# Patient Record
Sex: Female | Born: 1960
Health system: Southern US, Community
[De-identification: ages and names within clinical notes are randomized; demographics above are authoritative.]

## PROBLEM LIST (undated history)

## (undated) DIAGNOSIS — F418 Other specified anxiety disorders: Secondary | ICD-10-CM

## (undated) DIAGNOSIS — S92353A Displaced fracture of fifth metatarsal bone, unspecified foot, initial encounter for closed fracture: Secondary | ICD-10-CM

## (undated) DIAGNOSIS — Z860100 Personal history of colon polyps, unspecified: Secondary | ICD-10-CM

## (undated) DIAGNOSIS — E0500 Thyrotoxicosis with diffuse goiter without thyrotoxic crisis or storm: Secondary | ICD-10-CM

## (undated) DIAGNOSIS — M25511 Pain in right shoulder: Secondary | ICD-10-CM

## (undated) DIAGNOSIS — B1910 Unspecified viral hepatitis B without hepatic coma: Secondary | ICD-10-CM

## (undated) DIAGNOSIS — B019 Varicella without complication: Secondary | ICD-10-CM

## (undated) DIAGNOSIS — F429 Obsessive-compulsive disorder, unspecified: Secondary | ICD-10-CM

## (undated) DIAGNOSIS — E0590 Thyrotoxicosis, unspecified without thyrotoxic crisis or storm: Secondary | ICD-10-CM

## (undated) HISTORY — DX: Other specified anxiety disorders: F41.8

## (undated) HISTORY — DX: Pain in right shoulder: M25.511

## (undated) HISTORY — DX: Personal history of colon polyps, unspecified: Z86.0100

## (undated) HISTORY — DX: Thyrotoxicosis with diffuse goiter without thyrotoxic crisis or storm: E05.00

## (undated) HISTORY — DX: Displaced fracture of fifth metatarsal bone, unspecified foot, initial encounter for closed fracture: S92.353A

## (undated) HISTORY — PX: ABDOMINAL HYSTERECTOMY: SHX81

## (undated) HISTORY — PX: WISDOM TOOTH EXTRACTION: SHX21

## (undated) HISTORY — PX: ORBITAL OSTEOTOMY: SHX2114

## (undated) HISTORY — DX: Unspecified viral hepatitis B without hepatic coma: B19.10

## (undated) HISTORY — DX: Varicella without complication: B01.9

## (undated) HISTORY — DX: Thyrotoxicosis, unspecified without thyrotoxic crisis or storm: E05.90

## (undated) HISTORY — DX: Obsessive-compulsive disorder, unspecified: F42.9

## (undated) HISTORY — DX: Personal history of colonic polyps: Z86.010

---
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
## 2003-08-05 DIAGNOSIS — E0500 Thyrotoxicosis with diffuse goiter without thyrotoxic crisis or storm: Secondary | ICD-10-CM

## 2003-08-05 DIAGNOSIS — E0590 Thyrotoxicosis, unspecified without thyrotoxic crisis or storm: Secondary | ICD-10-CM

## 2003-08-05 HISTORY — PX: BLEPHAROPLASTY: SUR158

## 2003-08-05 HISTORY — DX: Thyrotoxicosis with diffuse goiter without thyrotoxic crisis or storm: E05.00

## 2003-08-05 HISTORY — DX: Thyrotoxicosis, unspecified without thyrotoxic crisis or storm: E05.90

## 2009-11-19 ENCOUNTER — Encounter: Admission: RE | Admit: 2009-11-19 | Discharge: 2009-11-19 | Payer: Self-pay | Admitting: Family Medicine

## 2015-08-05 DIAGNOSIS — S92353A Displaced fracture of fifth metatarsal bone, unspecified foot, initial encounter for closed fracture: Secondary | ICD-10-CM

## 2015-08-05 HISTORY — DX: Displaced fracture of fifth metatarsal bone, unspecified foot, initial encounter for closed fracture: S92.353A

## 2015-11-13 DIAGNOSIS — S92351A Displaced fracture of fifth metatarsal bone, right foot, initial encounter for closed fracture: Secondary | ICD-10-CM | POA: Diagnosis not present

## 2015-11-13 DIAGNOSIS — M79671 Pain in right foot: Secondary | ICD-10-CM | POA: Diagnosis not present

## 2015-11-14 DIAGNOSIS — S92354A Nondisplaced fracture of fifth metatarsal bone, right foot, initial encounter for closed fracture: Secondary | ICD-10-CM | POA: Diagnosis not present

## 2015-12-12 DIAGNOSIS — S92354D Nondisplaced fracture of fifth metatarsal bone, right foot, subsequent encounter for fracture with routine healing: Secondary | ICD-10-CM | POA: Diagnosis not present

## 2015-12-24 DIAGNOSIS — F4323 Adjustment disorder with mixed anxiety and depressed mood: Secondary | ICD-10-CM | POA: Diagnosis not present

## 2016-01-02 DIAGNOSIS — F4323 Adjustment disorder with mixed anxiety and depressed mood: Secondary | ICD-10-CM | POA: Diagnosis not present

## 2016-01-08 DIAGNOSIS — F4323 Adjustment disorder with mixed anxiety and depressed mood: Secondary | ICD-10-CM | POA: Diagnosis not present

## 2016-01-09 DIAGNOSIS — S92354D Nondisplaced fracture of fifth metatarsal bone, right foot, subsequent encounter for fracture with routine healing: Secondary | ICD-10-CM | POA: Diagnosis not present

## 2016-01-23 DIAGNOSIS — F4323 Adjustment disorder with mixed anxiety and depressed mood: Secondary | ICD-10-CM | POA: Diagnosis not present

## 2016-01-30 DIAGNOSIS — F4323 Adjustment disorder with mixed anxiety and depressed mood: Secondary | ICD-10-CM | POA: Diagnosis not present

## 2016-02-06 DIAGNOSIS — F4323 Adjustment disorder with mixed anxiety and depressed mood: Secondary | ICD-10-CM | POA: Diagnosis not present

## 2016-02-06 DIAGNOSIS — S92354D Nondisplaced fracture of fifth metatarsal bone, right foot, subsequent encounter for fracture with routine healing: Secondary | ICD-10-CM | POA: Diagnosis not present

## 2016-02-11 DIAGNOSIS — E039 Hypothyroidism, unspecified: Secondary | ICD-10-CM | POA: Diagnosis not present

## 2016-02-11 DIAGNOSIS — F419 Anxiety disorder, unspecified: Secondary | ICD-10-CM | POA: Diagnosis not present

## 2016-02-11 DIAGNOSIS — F339 Major depressive disorder, recurrent, unspecified: Secondary | ICD-10-CM | POA: Diagnosis not present

## 2016-02-12 DIAGNOSIS — F4323 Adjustment disorder with mixed anxiety and depressed mood: Secondary | ICD-10-CM | POA: Diagnosis not present

## 2016-02-20 DIAGNOSIS — F4323 Adjustment disorder with mixed anxiety and depressed mood: Secondary | ICD-10-CM | POA: Diagnosis not present

## 2016-02-26 DIAGNOSIS — S92514A Nondisplaced fracture of proximal phalanx of right lesser toe(s), initial encounter for closed fracture: Secondary | ICD-10-CM | POA: Diagnosis not present

## 2016-02-26 DIAGNOSIS — S9031XA Contusion of right foot, initial encounter: Secondary | ICD-10-CM | POA: Diagnosis not present

## 2016-02-26 DIAGNOSIS — S92524A Nondisplaced fracture of medial phalanx of right lesser toe(s), initial encounter for closed fracture: Secondary | ICD-10-CM | POA: Diagnosis not present

## 2016-02-27 DIAGNOSIS — F4323 Adjustment disorder with mixed anxiety and depressed mood: Secondary | ICD-10-CM | POA: Diagnosis not present

## 2016-03-18 DIAGNOSIS — F4323 Adjustment disorder with mixed anxiety and depressed mood: Secondary | ICD-10-CM | POA: Diagnosis not present

## 2016-04-02 DIAGNOSIS — F4323 Adjustment disorder with mixed anxiety and depressed mood: Secondary | ICD-10-CM | POA: Diagnosis not present

## 2016-08-12 DIAGNOSIS — E039 Hypothyroidism, unspecified: Secondary | ICD-10-CM | POA: Diagnosis not present

## 2016-08-12 DIAGNOSIS — F339 Major depressive disorder, recurrent, unspecified: Secondary | ICD-10-CM | POA: Diagnosis not present

## 2016-08-12 DIAGNOSIS — Z23 Encounter for immunization: Secondary | ICD-10-CM | POA: Diagnosis not present

## 2016-08-12 DIAGNOSIS — F419 Anxiety disorder, unspecified: Secondary | ICD-10-CM | POA: Diagnosis not present

## 2016-09-19 DIAGNOSIS — S82832A Other fracture of upper and lower end of left fibula, initial encounter for closed fracture: Secondary | ICD-10-CM | POA: Diagnosis not present

## 2016-09-19 DIAGNOSIS — M25572 Pain in left ankle and joints of left foot: Secondary | ICD-10-CM | POA: Diagnosis not present

## 2016-10-20 DIAGNOSIS — E039 Hypothyroidism, unspecified: Secondary | ICD-10-CM | POA: Diagnosis not present

## 2016-10-20 DIAGNOSIS — S93402A Sprain of unspecified ligament of left ankle, initial encounter: Secondary | ICD-10-CM | POA: Diagnosis not present

## 2016-10-20 DIAGNOSIS — S82402A Unspecified fracture of shaft of left fibula, initial encounter for closed fracture: Secondary | ICD-10-CM | POA: Diagnosis not present

## 2016-11-11 DIAGNOSIS — S82409A Unspecified fracture of shaft of unspecified fibula, initial encounter for closed fracture: Secondary | ICD-10-CM | POA: Diagnosis not present

## 2016-11-11 DIAGNOSIS — E039 Hypothyroidism, unspecified: Secondary | ICD-10-CM | POA: Diagnosis not present

## 2017-01-21 DIAGNOSIS — M79671 Pain in right foot: Secondary | ICD-10-CM | POA: Diagnosis not present

## 2017-01-21 DIAGNOSIS — L089 Local infection of the skin and subcutaneous tissue, unspecified: Secondary | ICD-10-CM | POA: Diagnosis not present

## 2017-01-21 DIAGNOSIS — E039 Hypothyroidism, unspecified: Secondary | ICD-10-CM | POA: Diagnosis not present

## 2017-01-21 DIAGNOSIS — M25471 Effusion, right ankle: Secondary | ICD-10-CM | POA: Diagnosis not present

## 2017-01-22 DIAGNOSIS — S93491A Sprain of other ligament of right ankle, initial encounter: Secondary | ICD-10-CM | POA: Diagnosis not present

## 2017-02-10 DIAGNOSIS — F419 Anxiety disorder, unspecified: Secondary | ICD-10-CM | POA: Diagnosis not present

## 2017-02-10 DIAGNOSIS — E039 Hypothyroidism, unspecified: Secondary | ICD-10-CM | POA: Diagnosis not present

## 2017-02-10 DIAGNOSIS — F339 Major depressive disorder, recurrent, unspecified: Secondary | ICD-10-CM | POA: Diagnosis not present

## 2017-02-24 DIAGNOSIS — S93491D Sprain of other ligament of right ankle, subsequent encounter: Secondary | ICD-10-CM | POA: Diagnosis not present

## 2017-03-24 DIAGNOSIS — E039 Hypothyroidism, unspecified: Secondary | ICD-10-CM | POA: Diagnosis not present

## 2017-06-29 DIAGNOSIS — E039 Hypothyroidism, unspecified: Secondary | ICD-10-CM | POA: Diagnosis not present

## 2017-07-01 DIAGNOSIS — F419 Anxiety disorder, unspecified: Secondary | ICD-10-CM | POA: Diagnosis not present

## 2017-07-01 DIAGNOSIS — E039 Hypothyroidism, unspecified: Secondary | ICD-10-CM | POA: Diagnosis not present

## 2017-07-01 DIAGNOSIS — F339 Major depressive disorder, recurrent, unspecified: Secondary | ICD-10-CM | POA: Diagnosis not present

## 2017-08-31 DIAGNOSIS — E039 Hypothyroidism, unspecified: Secondary | ICD-10-CM | POA: Diagnosis not present

## 2017-09-23 DIAGNOSIS — F339 Major depressive disorder, recurrent, unspecified: Secondary | ICD-10-CM | POA: Diagnosis not present

## 2017-09-23 DIAGNOSIS — F419 Anxiety disorder, unspecified: Secondary | ICD-10-CM | POA: Diagnosis not present

## 2017-09-25 DIAGNOSIS — F331 Major depressive disorder, recurrent, moderate: Secondary | ICD-10-CM | POA: Diagnosis not present

## 2017-09-30 DIAGNOSIS — F331 Major depressive disorder, recurrent, moderate: Secondary | ICD-10-CM | POA: Diagnosis not present

## 2017-10-16 DIAGNOSIS — F339 Major depressive disorder, recurrent, unspecified: Secondary | ICD-10-CM | POA: Diagnosis not present

## 2017-10-21 DIAGNOSIS — F339 Major depressive disorder, recurrent, unspecified: Secondary | ICD-10-CM | POA: Diagnosis not present

## 2017-10-29 DIAGNOSIS — F339 Major depressive disorder, recurrent, unspecified: Secondary | ICD-10-CM | POA: Diagnosis not present

## 2017-11-04 DIAGNOSIS — F339 Major depressive disorder, recurrent, unspecified: Secondary | ICD-10-CM | POA: Diagnosis not present

## 2017-11-04 DIAGNOSIS — F419 Anxiety disorder, unspecified: Secondary | ICD-10-CM | POA: Diagnosis not present

## 2017-11-04 DIAGNOSIS — M25512 Pain in left shoulder: Secondary | ICD-10-CM | POA: Diagnosis not present

## 2017-11-04 DIAGNOSIS — E039 Hypothyroidism, unspecified: Secondary | ICD-10-CM | POA: Diagnosis not present

## 2017-11-12 DIAGNOSIS — M25511 Pain in right shoulder: Secondary | ICD-10-CM

## 2017-11-12 HISTORY — DX: Pain in right shoulder: M25.511

## 2017-11-19 DIAGNOSIS — M7582 Other shoulder lesions, left shoulder: Secondary | ICD-10-CM | POA: Diagnosis not present

## 2017-12-09 DIAGNOSIS — Z8249 Family history of ischemic heart disease and other diseases of the circulatory system: Secondary | ICD-10-CM | POA: Diagnosis not present

## 2017-12-09 DIAGNOSIS — Z0000 Encounter for general adult medical examination without abnormal findings: Secondary | ICD-10-CM | POA: Diagnosis not present

## 2017-12-09 DIAGNOSIS — Z13220 Encounter for screening for lipoid disorders: Secondary | ICD-10-CM | POA: Diagnosis not present

## 2017-12-09 LAB — BASIC METABOLIC PANEL
BUN: 14 (ref 4–21)
CREATININE: 0.8 (ref 0.5–1.1)
GLUCOSE: 77

## 2017-12-09 LAB — LIPID PANEL
CHOLESTEROL: 174 (ref 0–200)
HDL: 66 (ref 35–70)
LDL Cholesterol: 94
TRIGLYCERIDES: 71 (ref 40–160)

## 2017-12-17 ENCOUNTER — Other Ambulatory Visit: Payer: Self-pay | Admitting: Family Medicine

## 2017-12-17 DIAGNOSIS — Z1231 Encounter for screening mammogram for malignant neoplasm of breast: Secondary | ICD-10-CM

## 2018-01-07 ENCOUNTER — Ambulatory Visit
Admission: RE | Admit: 2018-01-07 | Discharge: 2018-01-07 | Disposition: A | Discharge status: Home / Self Care | Payer: Federal, State, Local not specified - PPO | Source: Ambulatory Visit | Attending: Family Medicine | Admitting: Family Medicine

## 2018-01-07 DIAGNOSIS — Z1231 Encounter for screening mammogram for malignant neoplasm of breast: Secondary | ICD-10-CM | POA: Diagnosis not present

## 2018-01-13 DIAGNOSIS — Z86010 Personal history of colonic polyps: Secondary | ICD-10-CM | POA: Diagnosis not present

## 2018-02-08 DIAGNOSIS — E039 Hypothyroidism, unspecified: Secondary | ICD-10-CM | POA: Diagnosis not present

## 2018-03-08 DIAGNOSIS — D126 Benign neoplasm of colon, unspecified: Secondary | ICD-10-CM | POA: Diagnosis not present

## 2018-03-08 DIAGNOSIS — Z86010 Personal history of colonic polyps: Secondary | ICD-10-CM | POA: Diagnosis not present

## 2018-03-08 LAB — HM COLONOSCOPY

## 2018-03-10 DIAGNOSIS — D126 Benign neoplasm of colon, unspecified: Secondary | ICD-10-CM | POA: Diagnosis not present

## 2018-03-16 DIAGNOSIS — F411 Generalized anxiety disorder: Secondary | ICD-10-CM | POA: Diagnosis not present

## 2018-03-30 DIAGNOSIS — F411 Generalized anxiety disorder: Secondary | ICD-10-CM | POA: Diagnosis not present

## 2018-04-07 DIAGNOSIS — F411 Generalized anxiety disorder: Secondary | ICD-10-CM | POA: Diagnosis not present

## 2018-04-22 DIAGNOSIS — F332 Major depressive disorder, recurrent severe without psychotic features: Secondary | ICD-10-CM | POA: Diagnosis not present

## 2018-04-22 DIAGNOSIS — F411 Generalized anxiety disorder: Secondary | ICD-10-CM | POA: Diagnosis not present

## 2018-05-13 DIAGNOSIS — F411 Generalized anxiety disorder: Secondary | ICD-10-CM | POA: Diagnosis not present

## 2018-05-18 DIAGNOSIS — E039 Hypothyroidism, unspecified: Secondary | ICD-10-CM | POA: Diagnosis not present

## 2018-05-18 LAB — TSH: TSH: 11.6 — AB (ref 0.41–5.90)

## 2018-05-27 DIAGNOSIS — F411 Generalized anxiety disorder: Secondary | ICD-10-CM | POA: Diagnosis not present

## 2018-05-27 DIAGNOSIS — F332 Major depressive disorder, recurrent severe without psychotic features: Secondary | ICD-10-CM | POA: Diagnosis not present

## 2018-07-13 DIAGNOSIS — F418 Other specified anxiety disorders: Secondary | ICD-10-CM | POA: Diagnosis not present

## 2018-07-13 DIAGNOSIS — E0500 Thyrotoxicosis with diffuse goiter without thyrotoxic crisis or storm: Secondary | ICD-10-CM | POA: Diagnosis not present

## 2018-07-13 LAB — TSH: TSH: 1.97 (ref <=5.90)

## 2018-09-07 DIAGNOSIS — F418 Other specified anxiety disorders: Secondary | ICD-10-CM | POA: Diagnosis not present

## 2018-09-07 DIAGNOSIS — E0500 Thyrotoxicosis with diffuse goiter without thyrotoxic crisis or storm: Secondary | ICD-10-CM | POA: Diagnosis not present

## 2018-09-13 DIAGNOSIS — F332 Major depressive disorder, recurrent severe without psychotic features: Secondary | ICD-10-CM | POA: Diagnosis not present

## 2018-09-13 DIAGNOSIS — F411 Generalized anxiety disorder: Secondary | ICD-10-CM | POA: Diagnosis not present

## 2018-09-20 DIAGNOSIS — F332 Major depressive disorder, recurrent severe without psychotic features: Secondary | ICD-10-CM | POA: Diagnosis not present

## 2018-09-20 DIAGNOSIS — F411 Generalized anxiety disorder: Secondary | ICD-10-CM | POA: Diagnosis not present

## 2018-09-21 ENCOUNTER — Encounter (HOSPITAL_COMMUNITY): Payer: Self-pay | Admitting: Psychiatry

## 2018-09-21 ENCOUNTER — Encounter (HOSPITAL_COMMUNITY): Payer: Self-pay

## 2018-09-21 ENCOUNTER — Ambulatory Visit (HOSPITAL_COMMUNITY): Payer: Self-pay | Admitting: Psychiatry

## 2018-09-21 ENCOUNTER — Other Ambulatory Visit (HOSPITAL_COMMUNITY): Payer: Federal, State, Local not specified - PPO | Attending: Psychiatry | Admitting: Psychiatry

## 2018-09-21 DIAGNOSIS — F1021 Alcohol dependence, in remission: Secondary | ICD-10-CM | POA: Diagnosis not present

## 2018-09-21 DIAGNOSIS — R45851 Suicidal ideations: Secondary | ICD-10-CM | POA: Insufficient documentation

## 2018-09-21 DIAGNOSIS — Z87891 Personal history of nicotine dependence: Secondary | ICD-10-CM | POA: Insufficient documentation

## 2018-09-21 DIAGNOSIS — F4310 Post-traumatic stress disorder, unspecified: Secondary | ICD-10-CM | POA: Diagnosis not present

## 2018-09-21 DIAGNOSIS — Z79899 Other long term (current) drug therapy: Secondary | ICD-10-CM | POA: Diagnosis not present

## 2018-09-21 DIAGNOSIS — F330 Major depressive disorder, recurrent, mild: Secondary | ICD-10-CM | POA: Insufficient documentation

## 2018-09-21 DIAGNOSIS — F632 Kleptomania: Secondary | ICD-10-CM | POA: Insufficient documentation

## 2018-09-21 DIAGNOSIS — Z79890 Hormone replacement therapy (postmenopausal): Secondary | ICD-10-CM | POA: Insufficient documentation

## 2018-09-21 DIAGNOSIS — F332 Major depressive disorder, recurrent severe without psychotic features: Secondary | ICD-10-CM | POA: Diagnosis not present

## 2018-09-21 DIAGNOSIS — F411 Generalized anxiety disorder: Secondary | ICD-10-CM | POA: Insufficient documentation

## 2018-09-21 DIAGNOSIS — F331 Major depressive disorder, recurrent, moderate: Secondary | ICD-10-CM

## 2018-09-21 NOTE — Progress Notes (Signed)
    Daily Group Progress Note  Program: IOP  Group Time: 9am-12pm  Participation Level: Active  Behavioral Response: Appropriate, Sharing and Motivated  Type of Therapy:  Group Therapy; psycho-educational group, process group  Summary of Progress:  The purpose of this group is to utilize CBT and DBT skills in a group setting to increase use of healthy coping skills and decrease frequency and intensity of active mental health symptoms  9am-11am Clinician checked in with group members, assessing for SI/HI/psychosis and problematic symptoms. Clinician presented the topic of 'Letting Go of Emotional Suffering: Mindfulness of Your Current Emotion.' Clinician presented the DBT skill Ride the Wave related to distress tolerance. Clinician and group members discussed identifying and accepting feelings while limiting judgment and self criticism related to automatic thoughts and feelings. Clinician validated client feelings and utilized active listening skills including summarizing and reflective statements.Clinician provided clients with Sensory Coping Skills. Group members practiced in session and discussed modifications which can be used at work. Clinician provided options for focused breathing skills and practiced in session box breathing and other breathing exercises including utilizing breathing skills while walking to help distract from ruminating thoughts and feeling of anxiety in the body.  11am-12pm Process group co-facilitated by Chaplin with a focus on losses and how losses are grieved. Client participated in check in and grief/loss discussion, sharing struggle with non-resolved relationship with father who passed away. Client noted that due to this strained relationship, she raised her children making sure she showed love an affection daily. During group client completed assessment and met with psychiatrist. See notes for additional details.    Olegario Messier, LCSW
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
## 2018-09-22 ENCOUNTER — Other Ambulatory Visit (HOSPITAL_COMMUNITY): Payer: Federal, State, Local not specified - PPO | Admitting: Licensed Clinical Social Worker

## 2018-09-22 DIAGNOSIS — F411 Generalized anxiety disorder: Secondary | ICD-10-CM

## 2018-09-22 DIAGNOSIS — Z87891 Personal history of nicotine dependence: Secondary | ICD-10-CM | POA: Diagnosis not present

## 2018-09-22 DIAGNOSIS — F4310 Post-traumatic stress disorder, unspecified: Secondary | ICD-10-CM | POA: Diagnosis not present

## 2018-09-22 DIAGNOSIS — Z79890 Hormone replacement therapy (postmenopausal): Secondary | ICD-10-CM | POA: Diagnosis not present

## 2018-09-22 DIAGNOSIS — Z79899 Other long term (current) drug therapy: Secondary | ICD-10-CM | POA: Diagnosis not present

## 2018-09-22 DIAGNOSIS — F331 Major depressive disorder, recurrent, moderate: Secondary | ICD-10-CM

## 2018-09-22 DIAGNOSIS — F632 Kleptomania: Secondary | ICD-10-CM | POA: Diagnosis not present

## 2018-09-22 DIAGNOSIS — R45851 Suicidal ideations: Secondary | ICD-10-CM | POA: Diagnosis not present

## 2018-09-22 DIAGNOSIS — F1021 Alcohol dependence, in remission: Secondary | ICD-10-CM | POA: Diagnosis not present

## 2018-09-22 DIAGNOSIS — F332 Major depressive disorder, recurrent severe without psychotic features: Secondary | ICD-10-CM | POA: Diagnosis not present

## 2018-09-23 ENCOUNTER — Other Ambulatory Visit (HOSPITAL_COMMUNITY): Payer: Federal, State, Local not specified - PPO | Admitting: Licensed Clinical Social Worker

## 2018-09-23 DIAGNOSIS — F331 Major depressive disorder, recurrent, moderate: Secondary | ICD-10-CM

## 2018-09-23 DIAGNOSIS — F332 Major depressive disorder, recurrent severe without psychotic features: Secondary | ICD-10-CM | POA: Diagnosis not present

## 2018-09-23 DIAGNOSIS — R45851 Suicidal ideations: Secondary | ICD-10-CM | POA: Diagnosis not present

## 2018-09-23 DIAGNOSIS — Z79899 Other long term (current) drug therapy: Secondary | ICD-10-CM | POA: Diagnosis not present

## 2018-09-23 DIAGNOSIS — F632 Kleptomania: Secondary | ICD-10-CM | POA: Diagnosis not present

## 2018-09-23 DIAGNOSIS — F4310 Post-traumatic stress disorder, unspecified: Secondary | ICD-10-CM | POA: Diagnosis not present

## 2018-09-23 DIAGNOSIS — Z87891 Personal history of nicotine dependence: Secondary | ICD-10-CM | POA: Diagnosis not present

## 2018-09-23 DIAGNOSIS — Z79890 Hormone replacement therapy (postmenopausal): Secondary | ICD-10-CM | POA: Diagnosis not present

## 2018-09-23 DIAGNOSIS — F411 Generalized anxiety disorder: Secondary | ICD-10-CM | POA: Diagnosis not present

## 2018-09-23 DIAGNOSIS — F1021 Alcohol dependence, in remission: Secondary | ICD-10-CM | POA: Diagnosis not present

## 2018-09-24 ENCOUNTER — Telehealth (HOSPITAL_COMMUNITY): Payer: Self-pay | Admitting: Psychiatry

## 2018-09-24 ENCOUNTER — Other Ambulatory Visit (HOSPITAL_COMMUNITY): Payer: Federal, State, Local not specified - PPO | Admitting: Psychiatry

## 2018-09-27 ENCOUNTER — Telehealth (HOSPITAL_COMMUNITY): Payer: Self-pay | Admitting: Psychiatry

## 2018-09-27 ENCOUNTER — Other Ambulatory Visit (HOSPITAL_COMMUNITY): Payer: Federal, State, Local not specified - PPO | Admitting: Psychiatry

## 2018-09-27 NOTE — Progress Notes (Signed)
    Daily Group Progress Note  Program: IOP  Group Time: 9am-12pm  Participation Level: Active  Behavioral Response: Appropriate  Type of Therapy:  Group Therapy; process group, psycho-educational group  Summary of Progress:  Clinician and group members reviewed options to improve communication skills, including using I-statements, verbalizing thoughts, feelings and needs to others. Clinician and group members discussed having a goal of interactions prior to having conversations. Clinician presented Interpersonal Effectiveness skills DEARMAN, GIVE, and FAST. Clinician and group members processed how healthy or unhealthy communication has effected their relationships with others and self. Clinician facilitated discussion between group members related to maintaining hopefulness to address symptoms. Clients discussed difficulty with how mental health is viewed and addressed compared to physical illness. Clinician and group members discussed how to use assertive communication to get needs met when others do note see situations the same as client.  11am-12pm Yoga as mindfulness exercise facilitated by co-facilitator. Client participated in all group discussions and activities.  Olegario Messier, LCSW

## 2018-09-27 NOTE — Telephone Encounter (Signed)
D:  Pt had informed writer last week that she had an upcoming appt scheduled for today.  A:  Inform treatment team.

## 2018-09-27 NOTE — Progress Notes (Signed)
    Daily Group Progress Note  Program: IOP  Group Time: 9am-12pm  Participation Level: Active  Behavioral Response: Appropriate  Type of Therapy:  Group Therapy; process group, psycho-educational group  Summary of Progress:  The purpose of this group is to utilize CBT and DBT skills in a group setting to increase use of healthy coping skills and decrease frequency and intensity of active mental health symptoms.  9am-11am Clinician checked in with group members, assessing for SI/HI/psychosis and overall level of functioning. Clinician utilized 'Loaded Question' icebreaker to discuss self-sabotaging behaviors. Clinician presented the topic of Communication Roadblocks. Clinician and group members processed several examples including assumptions, poor listening, overreacting, and not being clear. Clinician and group members processed examples in daily living where these have been a problem, especially when creating boundaries. Clinician and group members discussed options to improve communication skills, including using I-statements, verbalizing thoughts, feelings and needs to others. Clinician and group members role played scenarios in group to gain mastery over roadblocks.  11am-12pm Psycho-educational group co-facilitated by Wellness staff with a focus on improving stress management by addressing physical health. Group members set goals in areas of nutrition, sleep, and exercise. Client participated in all group discussion and activities. Client shows progress toward goals as evidence by identifying wellness goals and how they could benefit her daily living.  Harlon Ditty, LCSW

## 2018-09-28 ENCOUNTER — Other Ambulatory Visit (HOSPITAL_COMMUNITY): Payer: Federal, State, Local not specified - PPO | Admitting: Licensed Clinical Social Worker

## 2018-09-28 DIAGNOSIS — F1021 Alcohol dependence, in remission: Secondary | ICD-10-CM | POA: Diagnosis not present

## 2018-09-28 DIAGNOSIS — Z79899 Other long term (current) drug therapy: Secondary | ICD-10-CM | POA: Diagnosis not present

## 2018-09-28 DIAGNOSIS — F411 Generalized anxiety disorder: Secondary | ICD-10-CM | POA: Diagnosis not present

## 2018-09-28 DIAGNOSIS — Z87891 Personal history of nicotine dependence: Secondary | ICD-10-CM | POA: Diagnosis not present

## 2018-09-28 DIAGNOSIS — F632 Kleptomania: Secondary | ICD-10-CM | POA: Diagnosis not present

## 2018-09-28 DIAGNOSIS — F4310 Post-traumatic stress disorder, unspecified: Secondary | ICD-10-CM | POA: Diagnosis not present

## 2018-09-28 DIAGNOSIS — Z79890 Hormone replacement therapy (postmenopausal): Secondary | ICD-10-CM | POA: Diagnosis not present

## 2018-09-28 DIAGNOSIS — R45851 Suicidal ideations: Secondary | ICD-10-CM | POA: Diagnosis not present

## 2018-09-28 DIAGNOSIS — F332 Major depressive disorder, recurrent severe without psychotic features: Secondary | ICD-10-CM | POA: Diagnosis not present

## 2018-09-29 ENCOUNTER — Other Ambulatory Visit (HOSPITAL_COMMUNITY): Payer: Federal, State, Local not specified - PPO

## 2018-09-30 ENCOUNTER — Other Ambulatory Visit (HOSPITAL_COMMUNITY): Payer: Federal, State, Local not specified - PPO | Admitting: Licensed Clinical Social Worker

## 2018-09-30 DIAGNOSIS — F332 Major depressive disorder, recurrent severe without psychotic features: Secondary | ICD-10-CM | POA: Diagnosis not present

## 2018-09-30 DIAGNOSIS — Z87891 Personal history of nicotine dependence: Secondary | ICD-10-CM | POA: Diagnosis not present

## 2018-09-30 DIAGNOSIS — F632 Kleptomania: Secondary | ICD-10-CM | POA: Diagnosis not present

## 2018-09-30 DIAGNOSIS — F411 Generalized anxiety disorder: Secondary | ICD-10-CM | POA: Diagnosis not present

## 2018-09-30 DIAGNOSIS — R45851 Suicidal ideations: Secondary | ICD-10-CM | POA: Diagnosis not present

## 2018-09-30 DIAGNOSIS — F331 Major depressive disorder, recurrent, moderate: Secondary | ICD-10-CM

## 2018-09-30 DIAGNOSIS — F1021 Alcohol dependence, in remission: Secondary | ICD-10-CM | POA: Diagnosis not present

## 2018-09-30 DIAGNOSIS — F4310 Post-traumatic stress disorder, unspecified: Secondary | ICD-10-CM | POA: Diagnosis not present

## 2018-09-30 DIAGNOSIS — Z79899 Other long term (current) drug therapy: Secondary | ICD-10-CM | POA: Diagnosis not present

## 2018-09-30 DIAGNOSIS — Z79890 Hormone replacement therapy (postmenopausal): Secondary | ICD-10-CM | POA: Diagnosis not present

## 2018-10-01 ENCOUNTER — Encounter (HOSPITAL_COMMUNITY): Payer: Self-pay | Admitting: Family

## 2018-10-01 ENCOUNTER — Other Ambulatory Visit (HOSPITAL_COMMUNITY): Payer: Federal, State, Local not specified - PPO | Admitting: Licensed Clinical Social Worker

## 2018-10-01 DIAGNOSIS — F4310 Post-traumatic stress disorder, unspecified: Secondary | ICD-10-CM | POA: Diagnosis not present

## 2018-10-01 DIAGNOSIS — F411 Generalized anxiety disorder: Secondary | ICD-10-CM | POA: Diagnosis not present

## 2018-10-01 DIAGNOSIS — Z79899 Other long term (current) drug therapy: Secondary | ICD-10-CM | POA: Diagnosis not present

## 2018-10-01 DIAGNOSIS — Z87891 Personal history of nicotine dependence: Secondary | ICD-10-CM | POA: Diagnosis not present

## 2018-10-01 DIAGNOSIS — F1021 Alcohol dependence, in remission: Secondary | ICD-10-CM | POA: Diagnosis not present

## 2018-10-01 DIAGNOSIS — Z79890 Hormone replacement therapy (postmenopausal): Secondary | ICD-10-CM | POA: Diagnosis not present

## 2018-10-01 DIAGNOSIS — R45851 Suicidal ideations: Secondary | ICD-10-CM | POA: Diagnosis not present

## 2018-10-01 DIAGNOSIS — F331 Major depressive disorder, recurrent, moderate: Secondary | ICD-10-CM

## 2018-10-01 DIAGNOSIS — F332 Major depressive disorder, recurrent severe without psychotic features: Secondary | ICD-10-CM | POA: Diagnosis not present

## 2018-10-01 DIAGNOSIS — F632 Kleptomania: Secondary | ICD-10-CM | POA: Diagnosis not present

## 2018-10-01 NOTE — Progress Notes (Signed)
  [facility] Intensive Outpatient Program Discharge Summary  Katie Rojas [id number]  Admission date: 09/21/2018 Discharge date: 10/01/2018  Reason for admission: Worsen Anxiety and Depression  Per admission assessment  Note: Katie Rojas 58 yo married female with long hx of anxiety predominantly genaralized type now (in the past also panic attacks) and recurrent depression which has worsened over past few months. She admits to having  Increased frequency of suicidal thoughst w/o plan. She has one past suicidal attempt and one inpatient psychiatric admission for severe anxiety attacks. Currently she denies having intense panic attacks but continues to struggle with general worrying. Patient is at this time followed by her PCP. Klonopin, which she had been on for close to 30 years, has recently been decreased from 1 mg bid to 1 mg in am and 0.5 mg at HS. Zoloft dose was increased at the same time to 150 mg. She is also on 300 mg of XR Wellbutrin.  In the past [name] also tried paroxetine and escitalopram. Katie Rojas admits to episodes of compulsive shoplifting with tension, excitement befire it happens and dysphoria, regret afterwards. She has been charged with shoplifting tyhree times in the past.   Family of Origin Issues: Reports her husband has been supportive since she has started attending Intensive Outpatient Program. (IOP) States she recently stopped drinking about 1 month ago. "before things got out of hand." Denied that she has substance abuse issues.   Progress in Program Toward Treatment Goals: Per treatment team Katie Rojas attended and participated with daily group sessions. Reports she is working on having a "positive outlook on life." Currently denying suicidal or homicidal ideations.  Progress (rationale): Katie Rojas to follow-up with therapist Denease Keenen and   Dr. Hinton Dyer on 10/19/2018  Take all medications as prescribed. Keep all follow-up appointments as scheduled.  Do not  consume alcohol or use illegal drugs while on prescription medications. Report any adverse effects from your medications to your primary care provider promptly.  In the event of recurrent symptoms or worsening symptoms, call 911, a crisis hotline, or go to the nearest emergency department for evaluation.    Katie Rack, NP 10/01/2018

## 2018-10-01 NOTE — Progress Notes (Signed)
    Daily Group Progress Note  Program: IOP  Group Time: 9am-12pm  Participation Level: Active  Behavioral Response: Appropriate  Type of Therapy:  Group Therapy; psycho-educational group, process group  Summary of Progress:  The purpose of the group is to utilize CBT and DBT skills in a face to face group setting to increase use of healthy coping skills and decrease frequency or intensity of active mental health symptoms.  9am-10:30am Clinician checked in with group members, assessing for SI/HI/psychosis and overall level of functioning. Clinician and group members completed 'Loaded Questions' icebreaker and clinician praised clients use of vulnerability in group setting. Clinician presented Self-Care Wheel and processed with clients the importance of a Balanced Life. Clinician and group members discussed ways to incorporate self care into routine and the benefits and barriers. Clinician and group members reviewed physical, psychological, emotional, spiritual, personal, and professional assessments and discussed plan to implement at least one healthy change in the next week. Clinician and group members created crisis plans, including identifying triggers/stressors and related feelings, early warning signs, coping skills and support systems, helpful and not helpful behaviors for support system to be aware of, and contact information for crisis situations.  10:30am-12pm Clinician and group members reviewed Coping Skills Worksheet for additional information to add to crisis plan. Clinician and group members discussed types of skills, examples, and pros and cons of each style of skill. Clinician and group also reviewed A-Z coping worksheet. Clinician and group members participated in [place] game, practicing managing mild anxiety in the moment, and discussing questions related to improving self esteem. Clinician inquired about self care activity planned for the weekend. Client participated in all group  activities and discussions. Client notes in particular on the wheel of self care, she did the opposite of each recommendation for the professional section. Client show progress toward goals as evidence by engaging in group discussions, identifying skills previously helpful and re-implimenting, such as journaling, and was able to participate in anxiety provoking activity in group despite reported anxiety.  Harlon Ditty, LCSW

## 2018-10-01 NOTE — Patient Instructions (Signed)
D:  Patient will successfully complete MH-IOP on 10-04-18.  A:  Discharge on 10-04-18.  Follow up with Dr. Hinton Dyer on 10-19-18 @ 9 a.m and patient will reschedule with therapist Skoglund Payne, [facility]).  Encouraged support groups.  R:  Patient receptive.

## 2018-10-04 ENCOUNTER — Other Ambulatory Visit (HOSPITAL_COMMUNITY): Payer: Federal, State, Local not specified - PPO | Attending: Psychiatry | Admitting: Licensed Clinical Social Worker

## 2018-10-04 DIAGNOSIS — F419 Anxiety disorder, unspecified: Secondary | ICD-10-CM | POA: Diagnosis not present

## 2018-10-04 DIAGNOSIS — Z818 Family history of other mental and behavioral disorders: Secondary | ICD-10-CM | POA: Diagnosis not present

## 2018-10-04 DIAGNOSIS — Z62810 Personal history of physical and sexual abuse in childhood: Secondary | ICD-10-CM | POA: Diagnosis not present

## 2018-10-04 DIAGNOSIS — F332 Major depressive disorder, recurrent severe without psychotic features: Secondary | ICD-10-CM | POA: Diagnosis not present

## 2018-10-04 DIAGNOSIS — F329 Major depressive disorder, single episode, unspecified: Secondary | ICD-10-CM | POA: Insufficient documentation

## 2018-10-04 DIAGNOSIS — F331 Major depressive disorder, recurrent, moderate: Secondary | ICD-10-CM

## 2018-10-04 NOTE — Progress Notes (Signed)
    Daily Group Progress Note  Program: IOP  Group Time: 9am-12pm  Participation Level: Active  Behavioral Response: Appropriate  Type of Therapy:  Group Therapy; process group, psycho-educational group  Summary of Progress:  The purpose of this group is to utilize CBT and DBT skills in a group setting to increase use of healthy coping skills and decrease frequency and intensity of active mental health symptoms.  9am-10:30am Clinician checked in with group members, assessing for SI/HI/psychosis and overall level of functioning. Clinician inquired about completed self care activities for members previously in attendance. Clinician and group members processed 'loaded questions' icebreaker, focusing on 'lying to save face.' Clinician presented the topic of mindfulness and group members discussed barriers to utilizing mindfulness as well as potential benefits. Clinician facilitated pebble meditation in session to practice mindfulness skill.  10:30am-12pm: Clinician presented mindfulness information on WHAT and HOW skills. Clinician and group members created worry dolls, focusing on using distraction skills for distress tolerance. Clinician provided clients with TIPP skill as a response to intense emotions. Clinician checked out inquiring about a self care activity for the day. Client engaged in discussion and activities. Client noted that crafting and repetitive activities she finds helpful for a distraction in managing symptoms.  Harlon Ditty, LCSW
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
## 2018-10-05 ENCOUNTER — Other Ambulatory Visit (HOSPITAL_COMMUNITY): Payer: Federal, State, Local not specified - PPO

## 2018-10-05 NOTE — Progress Notes (Signed)
    Daily Group Progress Note  Program: IOP  Group Time: 9am-12pm  Participation Level: Active  Behavioral Response: Appropriate  Type of Therapy:  Group Therapy; psycho-educational group, process group  Summary of Progress:  The purpose of this engagement is to utilize CBT and DBT skills in a group setting to increase use of healthy coping skills and decrease frequency and intensity of active mental helath symtposm.  9am-11am Clinician checked in with group members, assessing for SI/HI/psychosis and overall level of functioning. Clinician inquired about recent stressors and skills attempted. Clinician presented the topic of Setting Boundaries for healthy relationships. Clinician facilitated discussion between group members on what healthy boundaries look like with different people. Clinician provided clients with worksheet with signs of healthy and unhealthy boundaries. Clinician and group members discuss how poor boundaries are created and maintained.  11am-12pm Process group facilitated by [place] with a focus on grief and loss. Client engaged in all discussion and activities. Client notes her daughters set boundaries with her which she later identified as needed and effective.  Harlon Ditty, LCSW

## 2018-10-06 ENCOUNTER — Other Ambulatory Visit (HOSPITAL_COMMUNITY): Payer: Federal, State, Local not specified - PPO

## 2018-10-07 ENCOUNTER — Other Ambulatory Visit (HOSPITAL_COMMUNITY): Payer: Federal, State, Local not specified - PPO

## 2018-10-08 ENCOUNTER — Other Ambulatory Visit (HOSPITAL_COMMUNITY): Payer: Federal, State, Local not specified - PPO

## 2018-10-11 ENCOUNTER — Other Ambulatory Visit (HOSPITAL_COMMUNITY): Payer: Federal, State, Local not specified - PPO

## 2018-10-11 NOTE — Progress Notes (Signed)
    Daily Group Progress Note  Program: IOP  Group Time: 9am-12pm  Participation Level: Active  Behavioral Response: Appropriate  Type of Therapy:  Group Therapy; process group, psycho-educational group  Summary of Progress:  The purpose of this group is to utilize CBT and DBT Skills in a group setting to increase the use of healthy coping skills and decrease the frequency and intensity of mental health symptoms.  9am-10:30am  Clinician checked in with group members assessing for SI/HI/Psychosis and any recent changes in their lives as well as use of adaptive coping skills. Clients utilized loaded questions to process pressing concerns including the importance of love and coping with discontent.  10:30am-12pm  Clinician presented 'Brain Dump' activity and encouraged clients to write out stream of consciousness activities to document and analyze their thoughts in the moment. Clinician facilitated discussion on stressors clients had control of in their lives and combatted negative thinking via Socratic questioning. Clinician facilitated discussion over identifying which aspects of a situation clients did or did not have control over, and responses to both situations. Clinician provided clients with a list of Distress Tolerance skills and encouraged clients to consider which they could realistically integrate into their lives. Client was insightful regarding her areas of concern. She described her stressors as well as her hopes moving forward. Client was able to successfully dispute negative thoughts. Expressed concerns about her future prognosis and was encouraged by clinician to consider the skills she has learned and how they can be referred to and implemented during difficult times. Client expressed her attitude toward her childhood trauma and was assisted by the clinician in reframing and evaluating her past trauma's impact in her daily life.    Harlon Ditty, LCSW

## 2018-10-12 ENCOUNTER — Other Ambulatory Visit (HOSPITAL_COMMUNITY): Payer: Federal, State, Local not specified - PPO

## 2018-10-13 ENCOUNTER — Other Ambulatory Visit (HOSPITAL_COMMUNITY): Payer: Federal, State, Local not specified - PPO

## 2018-10-14 ENCOUNTER — Other Ambulatory Visit (HOSPITAL_COMMUNITY): Payer: Federal, State, Local not specified - PPO

## 2018-10-15 ENCOUNTER — Other Ambulatory Visit (HOSPITAL_COMMUNITY): Payer: Federal, State, Local not specified - PPO

## 2018-10-18 ENCOUNTER — Other Ambulatory Visit (HOSPITAL_COMMUNITY): Payer: Federal, State, Local not specified - PPO

## 2018-10-19 ENCOUNTER — Other Ambulatory Visit (HOSPITAL_COMMUNITY): Payer: Self-pay

## 2018-10-19 ENCOUNTER — Encounter (HOSPITAL_COMMUNITY): Payer: Self-pay | Admitting: Psychiatry

## 2018-10-19 ENCOUNTER — Other Ambulatory Visit: Payer: Self-pay

## 2018-10-19 ENCOUNTER — Other Ambulatory Visit (HOSPITAL_COMMUNITY): Payer: Federal, State, Local not specified - PPO

## 2018-10-19 ENCOUNTER — Ambulatory Visit (INDEPENDENT_AMBULATORY_CARE_PROVIDER_SITE_OTHER): Payer: Federal, State, Local not specified - PPO | Admitting: Psychiatry

## 2018-10-19 VITALS — BP 108/71 | HR 80 | Temp 98.5°F | Ht 65.0 in | Wt 140.0 lb

## 2018-10-19 DIAGNOSIS — F1010 Alcohol abuse, uncomplicated: Secondary | ICD-10-CM | POA: Insufficient documentation

## 2018-10-19 DIAGNOSIS — F632 Kleptomania: Secondary | ICD-10-CM | POA: Diagnosis not present

## 2018-10-19 DIAGNOSIS — F331 Major depressive disorder, recurrent, moderate: Secondary | ICD-10-CM | POA: Diagnosis not present

## 2018-10-19 DIAGNOSIS — F1021 Alcohol dependence, in remission: Secondary | ICD-10-CM

## 2018-10-19 DIAGNOSIS — F411 Generalized anxiety disorder: Secondary | ICD-10-CM | POA: Diagnosis not present

## 2018-10-19 MED ORDER — CLONAZEPAM 1 MG PO TABS
1.0000 mg | ORAL_TABLET | Freq: Two times a day (BID) | ORAL | 2 refills | Status: DC
Start: 2018-10-19 — End: 2018-11-08

## 2018-10-19 MED ORDER — SERTRALINE HCL 100 MG PO TABS: 200.0000 mg | ORAL_TABLET | Freq: Every day | ORAL | 0 refills | Status: DC

## 2018-10-19 MED ORDER — BUPROPION HCL ER (XL) 300 MG PO TB24: 300.0000 mg | ORAL_TABLET | Freq: Every day | ORAL | 0 refills | Status: DC

## 2018-10-19 MED ORDER — SERTRALINE HCL 100 MG PO TABS: ORAL_TABLET | ORAL | 0 refills | Status: DC
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
## 2018-10-20 ENCOUNTER — Other Ambulatory Visit (HOSPITAL_COMMUNITY): Payer: Federal, State, Local not specified - PPO

## 2018-10-21 ENCOUNTER — Encounter: Payer: Self-pay | Admitting: Family Medicine

## 2018-10-21 ENCOUNTER — Other Ambulatory Visit (HOSPITAL_COMMUNITY): Payer: Federal, State, Local not specified - PPO

## 2018-10-21 ENCOUNTER — Ambulatory Visit: Payer: Federal, State, Local not specified - PPO | Admitting: Family Medicine

## 2018-10-21 VITALS — BP 100/65 | HR 72 | Temp 97.9°F | Resp 16 | Ht 65.0 in | Wt 139.4 lb

## 2018-10-21 DIAGNOSIS — F331 Major depressive disorder, recurrent, moderate: Secondary | ICD-10-CM

## 2018-10-21 DIAGNOSIS — G8929 Other chronic pain: Secondary | ICD-10-CM

## 2018-10-21 DIAGNOSIS — E039 Hypothyroidism, unspecified: Secondary | ICD-10-CM | POA: Insufficient documentation

## 2018-10-21 DIAGNOSIS — Z7689 Persons encountering health services in other specified circumstances: Secondary | ICD-10-CM | POA: Diagnosis not present

## 2018-10-21 DIAGNOSIS — M546 Pain in thoracic spine: Secondary | ICD-10-CM

## 2018-10-21 LAB — T4, FREE: Free T4: 0.95 ng/dL (ref 0.60–1.60)

## 2018-10-21 LAB — TSH: TSH: 1.5 u[IU]/mL (ref 0.35–4.50)

## 2018-10-21 NOTE — Progress Notes (Signed)
Patient ID: Katie Rojas, female  DOB: 05/14/1961, 58 y.o.   MRN: [id number] Patient Care Team    Relationship Specialty Notifications Start End  Natalia Leatherwood, DO PCP - General Family Medicine  10/21/18   Magdalene Patricia, MD Consulting Physician Psychiatry  10/21/18   Gastroenterology, Deboraha Sprang    10/22/18     Chief Complaint  Patient presents with  . Establish Care    Fasting. No complaints. Pt sees [facility] who RX's meds. Pt is wanting thyroid to be managed by PCP.     Subjective:  Katie Rojas is a 58 y.o.  female present for new patient establishment. All past medical history, surgical history, allergies, family history, immunizations, medications and social history were updated in the electronic medical record today. All recent labs, ED visits and hospitalizations within the last year were reviewed. No prior PCP records available prior to patient being seen today. Hypothyroidism, unspecified type Patient reports she was due to have her thyroid rechecked around this time.  She was a patient of [facility], which is not in the EMR.  Her KP in showed a normal TSH 07/13/2018.  She states they were monitoring her every few months, because her medication doses have recently changed.  She states they recently increased her dose.  Back pain: She states she intermittent back pain that comes and goes right thoracolumbar area for the past few years. Pt did not share- but she has a h/o moderate daily alcohol use by EMR. She states her father died early in life from something with the liver or pancreas, so she has concerns. She reports it does improve with movement./stretches.  She denies injury or surgery in the past to her abdomen or back.  Major depressive disorder, recurrent episode, moderate (HCC) She is established with behavioral health.  She has completed IOP with [facility] last month.  He suffers from depression, general anxiety and panic attacks.  She was drinking  routinely up until approximately 3 months ago.  She has history of kleptomania.  She is seeing a therapist Yomira Marinelli. She is happy with her new psychiatry team and feels she has improved a great deal over the last few months.    Depression screen PHQ 2/9 10/21/2018  Decreased Interest 2  Down, Depressed, Hopeless 2  PHQ - 2 Score 4  Altered sleeping 2  Tired, decreased energy 1  Change in appetite 2  Feeling bad or failure about yourself  2  Trouble concentrating 2  Moving slowly or fidgety/restless 0  Suicidal thoughts 0  PHQ-9 Score 13  Difficult doing work/chores Somewhat difficult   GAD 7 : Generalized Anxiety Score 10/21/2018  Nervous, Anxious, on Edge 2  Control/stop worrying 2  Worry too much - different things 2  Trouble relaxing 1  Restless 0  Easily annoyed or irritable 1  Afraid - awful might happen 1  Total GAD 7 Score 9  Anxiety Difficulty Somewhat difficult     No flowsheet data found.  Immunization History  Administered Date(s) Administered  . Influenza,inj,Quad PF,6+ Mos 05/18/2018   No exam data present  Past Medical History:  Diagnosis Date  . Anxiety   . Chicken pox   . Depression   . Hepatitis B 1979  . History of colon polyps   . Hyperthyroidism 2005   Nuclear rad tx (I131)-- on synthoid  . Obsessive-compulsive disorder   . Ophthalmic Graves disease 2005   No Known Allergies Past Surgical History:  Procedure Laterality Date  . ABDOMINAL HYSTERECTOMY    . BLEPHAROPLASTY Bilateral 2005   Secondary from proptosis from ocular Graves' disease.  . ORBITAL OSTEOTOMY Bilateral    Ocular Graves' disease  . WISDOM TOOTH EXTRACTION     Family History  Problem Relation Age of Onset  . Schizophrenia Paternal Grandmother   . Arthritis Paternal Grandmother   . Drug abuse Brother   . Alcohol abuse Brother   . Learning disabilities Sister   . COPD Mother   . Hypertension Mother   . Miscarriages / [place] Mother   . Lung cancer Mother   .  Early death Father   . Hypertension Father   . Depression Daughter   . Alcohol abuse Maternal Grandmother   . Hearing loss Maternal Grandmother   . Hearing loss Paternal Grandfather   . Stroke Paternal Grandfather   . Depression Sister   . 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 Always use your most recent med list.        buPROPion 300 MG 24 hr tablet Commonly known as:  WELLBUTRIN XL Take 1 tablet (300 mg total) by mouth daily.   clonazePAM 1 MG tablet Commonly known as:  KLONOPIN Take 1 tablet (1 mg total) by mouth 2 (two) times daily. Take one tab in a.m and half a tab at hs   levothyroxine 88 MCG tablet Commonly known as:  SYNTHROID, LEVOTHROID Take 88 mcg by mouth daily before breakfast.   sertraline 100 MG tablet Commonly known as:  ZOLOFT Take 1.5 tabs (150 mg) by mouth daily       All past medical history, surgical history, allergies, family history, immunizations andmedications were updated in the EMR today and reviewed under the history and medication portions of their EMR.    Recent Results (from the past 2160 hour(s))  TSH     Status: None   Collection Time: 10/21/18  9:19 AM  Result Value Ref Range   TSH 1.50 0.35 - 4.50 uIU/mL  T4, free     Status: None   Collection Time: 10/21/18  9:19 AM  Result Value Ref Range   Free T4 0.95 0.60 - 1.60 ng/dL    Comment: Specimens from patients who are undergoing biotin therapy and /or ingesting biotin supplements may contain high levels of biotin.  The higher biotin concentration in these specimens interferes with this Free T4 assay.  Specimens that  contain high levels  of biotin may cause false high results for this Free T4 assay.  Please interpret results in light of the total clinical presentation of the patient.      Mm 3d Screen Breast Bilateral  Result Date: 01/07/2018  IMPRESSION: No mammographic evidence of malignancy. A result letter of this screening mammogram will be mailed directly to the patient. RECOMMENDATION: Screening mammogram in one year. (Code:SM-B-01Y) BI-RADS CATEGORY  1: Negative. Electronically Signed   By: Ted Mcalpine M.D.   On: 01/07/2018 16:58    ROS: 14 pt review of systems performed and  negative (unless mentioned in an HPI)  Objective: BP 100/65 (BP Location: Right Arm, Patient Position: Sitting, Cuff Size: Normal)   Pulse 72   Temp 97.9 F (36.6 C) (Oral)   Resp 16   Ht 5\' 5"  (1.651 m)   Wt 139 lb 6 oz (63.2 kg)   SpO2 100%   BMI 23.19 kg/m  Gen: Afebrile. No acute distress. Nontoxic in appearance, well-developed, well-nourished. HENT: AT. [place]. MMM, Eyes:Pupils Equal Round Reactive to light, Extraocular movements intact,  Conjunctiva without redness, discharge or icterus. Neck/lymp/endocrine: Supple, no lymphadenopathy, no thyromegaly CV: RRR no murmur, no edema, +2/4 P posterior tibialis pulses.  Chest: CTAB, no wheeze, rhonchi or crackles.  Skin: Warm and well-perfused. Skin intact. Neuro/Msk: Normal gait. PERLA. EOMi. Alert. Oriented x3.  Cranial nerves II through XII intact. Muscle strength 5/5 upper/lower extremity. DTRs equal bilaterally. Psych: Normal affect, dress and demeanor. Normal speech. Normal thought content and judgment.  Assessment/plan: Katie Rojas is a 58 y.o. female present for establishment  Hypothyroidism, unspecified type No Records were available prior to seeing patient today.  Will obtain TSH and free T4 today.  Refills will be provided on her Synthroid after results received.  If this test is also normal as her December test was, I do not see need to keep  repeating her tests.  That is of course unless once I receive her prior PCP records, it explains why she was being tested so frequently. - TSH - T4, free  Major depressive disorder, recurrent episode, moderate (HCC) She is being managed by psychology.  She reports she is doing much better and feels she is improving with her psychiatry team.  Thoracic back pain: Patient brought up this back pain after visit was being completed.  I have asked her to complete stretches and monitor.  From her description it does seem muscle skeletal in nature since there is improvement with movements/stretches.  Seems to be chronic in nature, however would want to track any liver/abdominal/back images or for enzymes enzymes that have been done prior given her history of alcohol use, will need to await records to do this. Once records received, will need to schedule her for her physical and I have asked her to perform stretches for her back and follow-up in 4 weeks if it is not improved.  She is agreeable to this plan.  Return in about 4 weeks (around 11/18/2018), or if symptoms worsen or fail to improve.  Note is dictated utilizing voice recognition software. Although note has been proof read prior to signing, occasional typographical errors still can be missed. If any questions arise, please do not hesitate to call for verification.  Electronically signed by: Felix Pacini, DO [facility] [place]

## 2018-10-21 NOTE — Patient Instructions (Signed)
I will refill your thyroid medications for you after we get the lab result.   When we get your records from [place]- we will call you and let you know when to schedule your physical.   Please help [place] help you:  We are honored you have chosen [facility] for your Primary Care home. Below you will find basic instructions that you may need to access in the future. Please help [place] help you by reading the instructions, which cover many of the frequent questions we experience.   Prescription refills and request:  -In order to allow more efficient response time, please call your pharmacy for all refills. They will forward the request electronically to [place]. This allows for the quickest possible response. Request left on a nurse line can take longer to refill, since these are checked as time allows between office patients and other phone calls.  - refill request can take up to 3-5 working days to complete.  - If request is sent electronically and request is appropiate, it is usually completed in 1-2 business days.  - all patients will need to be seen routinely for all chronic medical conditions requiring prescription medications (see follow-up below). If you are overdue for follow up on your condition, you will be asked to make an appointment and we will call in enough medication to cover you until your appointment (up to 30 days).  - all controlled substances will require a face to face visit to request/refill.  - if you desire your prescriptions to go through a new pharmacy, and have an active script at original pharmacy, you will need to call your pharmacy and have scripts transferred to new pharmacy. This is completed between the pharmacy locations and not by your provider.    Results: If any images or labs were ordered, it can take up to 1 week to get results depending on the test ordered and the lab/facility running and resulting the test. - Normal or stable results, which do not need further  discussion, may be released to your mychart immediately with attached note to you. A call may not be generated for normal results. Please make certain to sign up for mychart. If you have questions on how to activate your mychart you can call the front office.  - If your results need further discussion, our office will attempt to contact you via phone, and if unable to reach you after 2 attempts, we will release your abnormal result to your mychart with instructions.  - All results will be automatically released in mychart after 1 week.  - Your provider will provide you with explanation and instruction on all relevant material in your results. Please keep in mind, results and labs may appear confusing or abnormal to the untrained eye, but it does not mean they are actually abnormal for you personally. If you have any questions about your results that are not covered, or you desire more detailed explanation than what was provided, you should make an appointment with your provider to do so.   Our office handles many outgoing and incoming calls daily. If we have not contacted you within 1 week about your results, please check your mychart to see if there is a message first and if not, then contact our office.  In helping with this matter, you help decrease call volume, and therefore allow [place] to be able to respond to patients needs more efficiently.   Acute office visits (sick visit):  An acute visit  is intended for a new problem and are scheduled in shorter time slots to allow schedule openings for patients with new problems. This is the appropriate visit to discuss a new problem. Problems will not be addressed by phone call or Echart message. Appointment is needed if requesting treatment. In order to provide you with excellent quality medical care with proper time for you to explain your problem, have an exam and receive treatment with instructions, these appointments should be limited to one new problem per  visit. If you experience a new problem, in which you desire to be addressed, please make an acute office visit, we save openings on the schedule to accommodate you. Please do not save your new problem for any other type of visit, let us take care of it properly and quickly for you.   Follow up visits:  Depending on your condition(s) your provider will need to see you routinely in order to provide you with quality care and prescribe medication(s). Most chronic conditions (Example: hypertension, Diabetes, depression/anxiety... etc), require visits a couple times a year. Your provider will instruct you on proper follow up for your personal medical conditions and history. Please make certain to make follow up appointments for your condition as instructed. Failing to do so could result in lapse in your medication treatment/refills. If you request a refill, and are overdue to be seen on a condition, we will always provide you with a 30 day script (once) to allow you time to schedule.    Medicare wellness (well visit): - we have a wonderful Nurse Maudie Mercury), that will meet with you and provide you will yearly medicare wellness visits. These visits should occur yearly (can not be scheduled less than 1 calendar year apart) and cover preventive health, immunizations, advance directives and screenings you are entitled to yearly through your medicare benefits. Do not miss out on your entitled benefits, this is when medicare will pay for these benefits to be ordered for you.  These are strongly encouraged by your provider and is the appropriate type of visit to make certain you are up to date with all preventive health benefits. If you have not had your medicare wellness exam in the last 12 months, please make certain to schedule one by calling the office and schedule your medicare wellness with Maudie Mercury as soon as possible.   Yearly physical (well visit):  - Adults are recommended to be seen yearly for physicals. Check with  your insurance and date of your last physical, most insurances require one calendar year between physicals. Physicals include all preventive health topics, screenings, medical exam and labs that are appropriate for gender/age and history. You may have fasting labs needed at this visit. This is a well visit (not a sick visit), new problems should not be covered during this visit (see acute visit).  - Pediatric patients are seen more frequently when they are younger. Your provider will advise you on well child visit timing that is appropriate for your their age. - This is not a medicare wellness visit. Medicare wellness exams do not have an exam portion to the visit. Some medicare companies allow for a physical, some do not allow a yearly physical. If your medicare allows a yearly physical you can schedule the medicare wellness with our nurse Maudie Mercury and have your physical with your provider after, on the same day. Please check with insurance for your full benefits.   Late Policy/No Shows:  - all new patients should arrive 15-30 minutes earlier  than appointment to allow [place] time  to  obtain all personal demographics,  insurance information and for you to complete office paperwork. - All established patients should arrive 10-15 minutes earlier than appointment time to update all information and be checked in .  - In our best efforts to run on time, if you are late for your appointment you will be asked to either reschedule or if able, we will work you back into the schedule. There will be a wait time to work you back in the schedule,  depending on availability.  - If you are unable to make it to your appointment as scheduled, please call 24 hours ahead of time to allow [place] to fill the time slot with someone else who needs to be seen. If you do not cancel your appointment ahead of time, you may be charged a no show fee.

## 2018-10-22 ENCOUNTER — Telehealth: Payer: Self-pay | Admitting: Family Medicine

## 2018-10-22 ENCOUNTER — Other Ambulatory Visit (HOSPITAL_COMMUNITY): Payer: Federal, State, Local not specified - PPO

## 2018-10-22 ENCOUNTER — Encounter: Payer: Self-pay | Admitting: Family Medicine

## 2018-10-22 MED ORDER — LEVOTHYROXINE SODIUM 88 MCG PO TABS: 88.0000 ug | ORAL_TABLET | Freq: Every day | ORAL | 3 refills | Status: DC

## 2018-10-22 NOTE — Telephone Encounter (Signed)
Please inform patient the following information: Her thyroid levels are normal range. I have refilled  Her thyroid med for her 90 days w/ 3 refills.  F/U 6 months of fibromyalgia. Once we get her records we can guide her on when she is due for her CPE

## 2018-10-22 NOTE — Telephone Encounter (Signed)
We will tell her when her CPE is due after records and I misspoke- I meant her back pain she stated she had. If  It is still present or worsening over the next 4 weeks--> then followup on her back pain.  Thanks.

## 2018-10-22 NOTE — Telephone Encounter (Signed)
Pt advised, she stated that she does not have fibromyalgia. Does she need to come back in 6 months or wait to hear about when to schedule a CPE? Please advise. Thanks.

## 2018-10-22 NOTE — Telephone Encounter (Signed)
Pt advised and voiced understanding.   

## 2018-10-25 ENCOUNTER — Other Ambulatory Visit (HOSPITAL_COMMUNITY): Payer: Federal, State, Local not specified - PPO

## 2018-10-25 DIAGNOSIS — F331 Major depressive disorder, recurrent, moderate: Secondary | ICD-10-CM | POA: Diagnosis not present

## 2018-10-25 DIAGNOSIS — F632 Kleptomania: Secondary | ICD-10-CM | POA: Diagnosis not present

## 2018-10-25 DIAGNOSIS — F411 Generalized anxiety disorder: Secondary | ICD-10-CM | POA: Diagnosis not present

## 2018-10-26 ENCOUNTER — Other Ambulatory Visit (HOSPITAL_COMMUNITY): Payer: Federal, State, Local not specified - PPO

## 2018-10-27 ENCOUNTER — Other Ambulatory Visit (HOSPITAL_COMMUNITY): Payer: Federal, State, Local not specified - PPO

## 2018-10-28 ENCOUNTER — Other Ambulatory Visit (HOSPITAL_COMMUNITY): Payer: Federal, State, Local not specified - PPO

## 2018-10-29 ENCOUNTER — Other Ambulatory Visit (HOSPITAL_COMMUNITY): Payer: Federal, State, Local not specified - PPO

## 2018-11-01 ENCOUNTER — Other Ambulatory Visit (HOSPITAL_COMMUNITY): Payer: Federal, State, Local not specified - PPO

## 2018-11-02 ENCOUNTER — Other Ambulatory Visit (HOSPITAL_COMMUNITY): Payer: Federal, State, Local not specified - PPO

## 2018-11-03 ENCOUNTER — Other Ambulatory Visit (HOSPITAL_COMMUNITY): Payer: Federal, State, Local not specified - PPO

## 2018-11-04 ENCOUNTER — Other Ambulatory Visit (HOSPITAL_COMMUNITY): Payer: Federal, State, Local not specified - PPO

## 2018-11-05 ENCOUNTER — Encounter: Payer: Self-pay | Admitting: Family Medicine

## 2018-11-05 ENCOUNTER — Other Ambulatory Visit (HOSPITAL_COMMUNITY): Payer: Federal, State, Local not specified - PPO

## 2018-11-05 DIAGNOSIS — E0500 Thyrotoxicosis with diffuse goiter without thyrotoxic crisis or storm: Secondary | ICD-10-CM | POA: Insufficient documentation

## 2018-11-08 ENCOUNTER — Other Ambulatory Visit (HOSPITAL_COMMUNITY): Payer: Self-pay | Admitting: Psychiatry

## 2018-11-08 ENCOUNTER — Other Ambulatory Visit (HOSPITAL_COMMUNITY): Payer: Federal, State, Local not specified - PPO

## 2018-11-08 ENCOUNTER — Telehealth (HOSPITAL_COMMUNITY): Payer: Self-pay

## 2018-11-08 MED ORDER — CLONAZEPAM 1 MG PO TABS: 1.0000 mg | ORAL_TABLET | Freq: Three times a day (TID) | ORAL | 1 refills | Status: DC | PRN

## 2018-11-08 NOTE — Telephone Encounter (Signed)
I increased Klonopin from bid to tid prn.  OP

## 2018-11-08 NOTE — Telephone Encounter (Signed)
Patient is calling to see if you will increase her anxiety medication. She states that she is having panic attacks. Please review and advise, thank you

## 2018-11-08 NOTE — Telephone Encounter (Signed)
I called patient and let her know 

## 2018-11-09 ENCOUNTER — Other Ambulatory Visit (HOSPITAL_COMMUNITY): Payer: Federal, State, Local not specified - PPO

## 2018-11-09 DIAGNOSIS — F331 Major depressive disorder, recurrent, moderate: Secondary | ICD-10-CM | POA: Diagnosis not present

## 2018-11-09 DIAGNOSIS — F632 Kleptomania: Secondary | ICD-10-CM | POA: Diagnosis not present

## 2018-11-09 DIAGNOSIS — F411 Generalized anxiety disorder: Secondary | ICD-10-CM | POA: Diagnosis not present

## 2018-11-10 ENCOUNTER — Other Ambulatory Visit (HOSPITAL_COMMUNITY): Payer: Federal, State, Local not specified - PPO

## 2018-11-11 ENCOUNTER — Other Ambulatory Visit (HOSPITAL_COMMUNITY): Payer: Federal, State, Local not specified - PPO

## 2018-11-12 ENCOUNTER — Other Ambulatory Visit (HOSPITAL_COMMUNITY): Payer: Federal, State, Local not specified - PPO

## 2018-11-15 ENCOUNTER — Other Ambulatory Visit (HOSPITAL_COMMUNITY): Payer: Federal, State, Local not specified - PPO

## 2018-11-18 DIAGNOSIS — F632 Kleptomania: Secondary | ICD-10-CM | POA: Diagnosis not present

## 2018-11-18 DIAGNOSIS — F331 Major depressive disorder, recurrent, moderate: Secondary | ICD-10-CM | POA: Diagnosis not present

## 2018-11-18 DIAGNOSIS — F411 Generalized anxiety disorder: Secondary | ICD-10-CM | POA: Diagnosis not present

## 2018-11-18 DIAGNOSIS — F909 Attention-deficit hyperactivity disorder, unspecified type: Secondary | ICD-10-CM | POA: Diagnosis not present

## 2018-11-22 ENCOUNTER — Encounter: Payer: Self-pay | Admitting: Family Medicine

## 2018-11-22 ENCOUNTER — Telehealth: Payer: Self-pay | Admitting: Family Medicine

## 2018-11-22 NOTE — Telephone Encounter (Signed)
Pt was called and scheduled for CPE at the end of May. Pt verbalized understanding to come fasting for labs.

## 2018-11-22 NOTE — Telephone Encounter (Signed)
Please inform patient the following information: I have finally received and been able to review her records from [place]. We promised to call her once we did and guide her on when her CPE is due.  Please schedule her for CPE at the end of MAY. We will complete fasting labs at that visit as well.  I hope she is well and safe!

## 2018-11-29 ENCOUNTER — Other Ambulatory Visit: Payer: Self-pay

## 2018-11-29 ENCOUNTER — Ambulatory Visit (INDEPENDENT_AMBULATORY_CARE_PROVIDER_SITE_OTHER): Payer: Federal, State, Local not specified - PPO | Admitting: Psychiatry

## 2018-11-29 DIAGNOSIS — F411 Generalized anxiety disorder: Secondary | ICD-10-CM

## 2018-11-29 DIAGNOSIS — F1021 Alcohol dependence, in remission: Secondary | ICD-10-CM

## 2018-11-29 DIAGNOSIS — F331 Major depressive disorder, recurrent, moderate: Secondary | ICD-10-CM

## 2018-11-29 DIAGNOSIS — F632 Kleptomania: Secondary | ICD-10-CM | POA: Diagnosis not present

## 2018-11-29 MED ORDER — BUPROPION HCL ER (XL) 300 MG PO TB24: 300.0000 mg | ORAL_TABLET | Freq: Every day | ORAL | 0 refills | Status: DC

## 2018-11-29 MED ORDER — CLONAZEPAM 1 MG PO TABS: 1.0000 mg | ORAL_TABLET | Freq: Three times a day (TID) | ORAL | 2 refills | Status: DC | PRN

## 2018-11-29 MED ORDER — SERTRALINE HCL 100 MG PO TABS: 200.0000 mg | ORAL_TABLET | Freq: Every day | ORAL | 0 refills | Status: DC
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
## 2018-12-01 DIAGNOSIS — F632 Kleptomania: Secondary | ICD-10-CM | POA: Diagnosis not present

## 2018-12-01 DIAGNOSIS — F411 Generalized anxiety disorder: Secondary | ICD-10-CM | POA: Diagnosis not present

## 2018-12-01 DIAGNOSIS — F331 Major depressive disorder, recurrent, moderate: Secondary | ICD-10-CM | POA: Diagnosis not present

## 2018-12-01 DIAGNOSIS — F909 Attention-deficit hyperactivity disorder, unspecified type: Secondary | ICD-10-CM | POA: Diagnosis not present

## 2018-12-08 DIAGNOSIS — F909 Attention-deficit hyperactivity disorder, unspecified type: Secondary | ICD-10-CM | POA: Diagnosis not present

## 2018-12-08 DIAGNOSIS — F332 Major depressive disorder, recurrent severe without psychotic features: Secondary | ICD-10-CM | POA: Diagnosis not present

## 2018-12-08 DIAGNOSIS — F411 Generalized anxiety disorder: Secondary | ICD-10-CM | POA: Diagnosis not present

## 2018-12-08 DIAGNOSIS — F632 Kleptomania: Secondary | ICD-10-CM | POA: Diagnosis not present

## 2018-12-15 DIAGNOSIS — F632 Kleptomania: Secondary | ICD-10-CM | POA: Diagnosis not present

## 2018-12-15 DIAGNOSIS — F411 Generalized anxiety disorder: Secondary | ICD-10-CM | POA: Diagnosis not present

## 2018-12-15 DIAGNOSIS — F322 Major depressive disorder, single episode, severe without psychotic features: Secondary | ICD-10-CM | POA: Diagnosis not present

## 2018-12-15 DIAGNOSIS — F909 Attention-deficit hyperactivity disorder, unspecified type: Secondary | ICD-10-CM | POA: Diagnosis not present

## 2018-12-16 ENCOUNTER — Other Ambulatory Visit (HOSPITAL_COMMUNITY): Payer: Self-pay

## 2018-12-16 MED ORDER — SERTRALINE HCL 100 MG PO TABS: 200.0000 mg | ORAL_TABLET | Freq: Every day | ORAL | 0 refills | Status: DC

## 2018-12-22 DIAGNOSIS — F909 Attention-deficit hyperactivity disorder, unspecified type: Secondary | ICD-10-CM | POA: Diagnosis not present

## 2018-12-22 DIAGNOSIS — F411 Generalized anxiety disorder: Secondary | ICD-10-CM | POA: Diagnosis not present

## 2018-12-22 DIAGNOSIS — F322 Major depressive disorder, single episode, severe without psychotic features: Secondary | ICD-10-CM | POA: Diagnosis not present

## 2018-12-22 DIAGNOSIS — F632 Kleptomania: Secondary | ICD-10-CM | POA: Diagnosis not present

## 2018-12-31 ENCOUNTER — Encounter: Payer: Self-pay | Admitting: Family Medicine

## 2019-01-10 ENCOUNTER — Telehealth (HOSPITAL_COMMUNITY): Payer: Self-pay

## 2019-01-10 ENCOUNTER — Other Ambulatory Visit: Payer: Self-pay

## 2019-01-10 ENCOUNTER — Ambulatory Visit (INDEPENDENT_AMBULATORY_CARE_PROVIDER_SITE_OTHER): Payer: Federal, State, Local not specified - PPO | Admitting: Psychiatry

## 2019-01-10 DIAGNOSIS — F331 Major depressive disorder, recurrent, moderate: Secondary | ICD-10-CM

## 2019-01-10 DIAGNOSIS — F1021 Alcohol dependence, in remission: Secondary | ICD-10-CM

## 2019-01-10 DIAGNOSIS — F411 Generalized anxiety disorder: Secondary | ICD-10-CM | POA: Diagnosis not present

## 2019-01-10 DIAGNOSIS — F632 Kleptomania: Secondary | ICD-10-CM | POA: Diagnosis not present

## 2019-01-10 MED ORDER — ARIPIPRAZOLE 2 MG PO TABS: 2.0000 mg | ORAL_TABLET | Freq: Every day | ORAL | 1 refills | Status: DC

## 2019-01-10 MED ORDER — CLONAZEPAM 1 MG PO TABS: 1.0000 mg | ORAL_TABLET | Freq: Three times a day (TID) | ORAL | 2 refills | Status: DC | PRN

## 2019-01-10 MED ORDER — BUPROPION HCL ER (XL) 300 MG PO TB24: 300.0000 mg | ORAL_TABLET | Freq: Every day | ORAL | 0 refills | Status: DC

## 2019-01-10 NOTE — Telephone Encounter (Signed)
Patient called regarding her medication. She had an appointment this morning and stated that she talked with the doctor about starting Cymbalta. She stated that she is confused because she was started on Abilify 2mg  and was wondering if there is a reason she was started on Abilify instead of Cymbalta. Please review and advise. Thank you.

## 2019-01-10 NOTE — Telephone Encounter (Signed)
Patient called she said that she was given Abilify at the pharmacy and was confused because you had talked about Cymbalta. Please review and advise, thank you

## 2019-01-10 NOTE — Telephone Encounter (Signed)
She is really confused and quite likely has ADD - we discussed Abilify which she brought up herself! She is on Zoloft plus Wellbutrin already.
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
## 2019-01-11 ENCOUNTER — Other Ambulatory Visit (HOSPITAL_COMMUNITY): Payer: Self-pay | Admitting: Psychiatry

## 2019-01-11 DIAGNOSIS — F909 Attention-deficit hyperactivity disorder, unspecified type: Secondary | ICD-10-CM | POA: Diagnosis not present

## 2019-01-11 DIAGNOSIS — F322 Major depressive disorder, single episode, severe without psychotic features: Secondary | ICD-10-CM | POA: Diagnosis not present

## 2019-01-11 DIAGNOSIS — F411 Generalized anxiety disorder: Secondary | ICD-10-CM | POA: Diagnosis not present

## 2019-01-11 DIAGNOSIS — F632 Kleptomania: Secondary | ICD-10-CM | POA: Diagnosis not present

## 2019-01-11 NOTE — Telephone Encounter (Signed)
It appears that she is really interested in trying Cymbalta so we could switch her from Zoloft to this antidepressant. Epic however shows that it is not reimbursable by her insurance. Even in a generic form? Can we somehow check it it can be authorized?

## 2019-01-11 NOTE — Telephone Encounter (Signed)
I called patient back and relayed the doctors message, patient states she will try the Abilify.

## 2019-01-11 NOTE — Telephone Encounter (Signed)
I got similar message from [place] yestarday and the answer is the same: patient is on two antidepressants and possibly would benefit from augmentation. Wew discussed addition of Abilify (which she herself brought up) not Cymbalta. If Abilify does not help we could possibly cross taper Lexapro to Cymbalta if she desires to try this medication. It was never mentioned during our session though!

## 2019-01-11 NOTE — Telephone Encounter (Signed)
Yes, she is confused. I spoke to her this morning and she said that she would try the Abilify. She called back an hour later and stated that she read more about it and it causes weight gain, patient states she is not going to take the Abilify. She wants to know if you have any other ideas. Please review and advise, thank you

## 2019-01-20 DIAGNOSIS — F632 Kleptomania: Secondary | ICD-10-CM | POA: Diagnosis not present

## 2019-01-20 DIAGNOSIS — F909 Attention-deficit hyperactivity disorder, unspecified type: Secondary | ICD-10-CM | POA: Diagnosis not present

## 2019-01-20 DIAGNOSIS — F411 Generalized anxiety disorder: Secondary | ICD-10-CM | POA: Diagnosis not present

## 2019-01-20 DIAGNOSIS — F322 Major depressive disorder, single episode, severe without psychotic features: Secondary | ICD-10-CM | POA: Diagnosis not present

## 2019-01-27 ENCOUNTER — Other Ambulatory Visit (HOSPITAL_COMMUNITY): Payer: Self-pay | Admitting: Psychiatry

## 2019-02-02 ENCOUNTER — Telehealth: Payer: Self-pay | Admitting: Family Medicine

## 2019-02-02 ENCOUNTER — Other Ambulatory Visit: Payer: Self-pay

## 2019-02-02 ENCOUNTER — Encounter: Payer: Self-pay | Admitting: Family Medicine

## 2019-02-02 ENCOUNTER — Ambulatory Visit (INDEPENDENT_AMBULATORY_CARE_PROVIDER_SITE_OTHER): Payer: Medicare Other | Admitting: Family Medicine

## 2019-02-02 VITALS — BP 100/66 | HR 69 | Temp 97.7°F | Resp 16 | Ht 65.0 in | Wt 141.4 lb

## 2019-02-02 DIAGNOSIS — F331 Major depressive disorder, recurrent, moderate: Secondary | ICD-10-CM | POA: Diagnosis not present

## 2019-02-02 DIAGNOSIS — F411 Generalized anxiety disorder: Secondary | ICD-10-CM

## 2019-02-02 DIAGNOSIS — Z13220 Encounter for screening for lipoid disorders: Secondary | ICD-10-CM

## 2019-02-02 DIAGNOSIS — Z0000 Encounter for general adult medical examination without abnormal findings: Secondary | ICD-10-CM | POA: Diagnosis not present

## 2019-02-02 DIAGNOSIS — Z1159 Encounter for screening for other viral diseases: Secondary | ICD-10-CM | POA: Diagnosis not present

## 2019-02-02 DIAGNOSIS — E039 Hypothyroidism, unspecified: Secondary | ICD-10-CM | POA: Diagnosis not present

## 2019-02-02 DIAGNOSIS — Z130 Encounter for screening for diseases of the blood and blood-forming organs and certain disorders involving the immune mechanism: Secondary | ICD-10-CM

## 2019-02-02 DIAGNOSIS — Z1231 Encounter for screening mammogram for malignant neoplasm of breast: Secondary | ICD-10-CM

## 2019-02-02 DIAGNOSIS — Z131 Encounter for screening for diabetes mellitus: Secondary | ICD-10-CM | POA: Diagnosis not present

## 2019-02-02 DIAGNOSIS — N183 Chronic kidney disease, stage 3 unspecified: Secondary | ICD-10-CM | POA: Insufficient documentation

## 2019-02-02 DIAGNOSIS — Z23 Encounter for immunization: Secondary | ICD-10-CM | POA: Diagnosis not present

## 2019-02-02 DIAGNOSIS — E2839 Other primary ovarian failure: Secondary | ICD-10-CM

## 2019-02-02 LAB — COMPREHENSIVE METABOLIC PANEL
ALT: 15 U/L (ref 0–35)
AST: 16 U/L (ref 0–37)
Albumin: 4.5 g/dL (ref 3.5–5.2)
Alkaline Phosphatase: 68 U/L (ref 39–117)
BUN: 11 mg/dL (ref 6–23)
CO2: 32 mEq/L (ref 19–32)
Calcium: 9.2 mg/dL (ref 8.4–10.5)
Chloride: 102 mEq/L (ref 96–112)
Creatinine, Ser: 1.06 mg/dL (ref 0.40–1.20)
GFR: 52.87 mL/min — ABNORMAL LOW (ref >60.00)
Glucose, Bld: 84 mg/dL (ref 70–99)
Potassium: 5 mEq/L (ref 3.5–5.1)
Sodium: 139 mEq/L (ref 135–145)
Total Bilirubin: 0.4 mg/dL (ref 0.2–1.2)
Total Protein: 6.8 g/dL (ref 6.0–8.3)

## 2019-02-02 LAB — LIPID PANEL
Cholesterol: 222 mg/dL — ABNORMAL HIGH (ref 0–200)
HDL: 69 mg/dL (ref >39.00)
LDL Cholesterol: 127 mg/dL — ABNORMAL HIGH (ref 0–99)
NonHDL: 152.84
Total CHOL/HDL Ratio: 3
Triglycerides: 130 mg/dL (ref 0.0–149.0)
VLDL: 26 mg/dL (ref 0.0–40.0)

## 2019-02-02 LAB — CBC
HCT: 39.4 % (ref 36.0–46.0)
Hemoglobin: 13.2 g/dL (ref 12.0–15.0)
MCHC: 33.6 g/dL (ref 30.0–36.0)
MCV: 94.2 fl (ref 78.0–100.0)
Platelets: 289 10*3/uL (ref 150.0–400.0)
RBC: 4.19 Mil/uL (ref 3.87–5.11)
RDW: 13.2 % (ref 11.5–15.5)
WBC: 3.7 10*3/uL — ABNORMAL LOW (ref 4.0–10.5)

## 2019-02-02 LAB — HEMOGLOBIN A1C: Hgb A1c MFr Bld: 5.4 % (ref 4.6–6.5)

## 2019-02-02 NOTE — Progress Notes (Signed)
Patient ID: Katie RastDonna Rhea Rojas, female  DOB: 1961-07-04, 58 y.o.   MRN: [id number] Patient Care Team    Relationship Specialty Notifications Start End  Natalia LeatherwoodKuneff, Zakhi Dupre A, DO PCP - General Family Medicine  10/21/18   Magdalene PatriciaPucilowski, Olgierd A, MD Consulting Physician Psychiatry  10/21/18   [facility]    10/22/18     Chief Complaint  Patient presents with   Annual Exam    No complaints. Last mammogram 01/2018. Pt agreed to TD immunization today- also would like to discuss Pap smear and if she needs one     Subjective:  Katie Rojas is a 58 y.o.  Female  present for CPE. All past medical history, surgical history, allergies, family history, immunizations, medications and social history were updated in the electronic medical record today. All recent labs, ED visits and hospitalizations within the last year were reviewed.  Hypothyroidism, unspecified type Patient reports she was due to have her thyroid rechecked around this time.  She was a patient of [facility], which is not in the EMR.  Her KP in showed a normal TSH 07/13/2018.  She states they were monitoring her every few months, because her medication doses have recently changed.  She states they recently increased her dose.  Major depressive disorder, recurrent episode, moderate (HCC) She is established with behavioral health.  She has completed IOP with [facility].  SHe suffers from depression, general anxiety and panic attacks.  She was drinking routinely up until approximately Jan 2020.  She has history of kleptomania.  She is seeing a therapist Abel PrestoDonna Hood. She is happy with her new psychiatry team and feels she has improved a great deal.   Health maintenance:  Colon[facility]: [facility]05/2018, [facility] Mammogram: completed:01/2018, birads 1.  Cervical cancer screening: hysterectomy Immunizations: tdap updated today, Influenza 2019 (encouraged yearly), Shingrix #1 today Infectious disease screening: HIV completed in the  past, Hep C  Pt agreeable to testing today.  DEXA: ordered today- caucasian, thin, estrogen def, former smoker, h/o graves disease Assistive device: none Oxygen WUJ:WJXBuse:none Patient has a Dental home. Hospitalizations/ED visits: reviewed  [facility] 2[facility] 02/02/2019 10/21/2018  Decreased Interest 2 2  Down, Depressed, Hopeless 3 2  PHQ - 2 Score 5 4  Altered sleeping 1 2  Tired, decreased energy 2 1  Change in appetite 2 2  Feeling bad or failure about yourself  3 2  Trouble concentrating 2 2  Moving slowly or fidgety/restless 0 0  Suicidal thoughts 1 0  PHQ-9 Score 16 13  Difficult doing work/chores Very difficult Somewhat difficult   GAD 7 : Generalized Anxiety Score 02/02/2019 10/21/2018  Nervous, Anxious, on Edge 2 2  Control/stop worrying 3 2  Worry too much - different things 3 2  Trouble relaxing 2 1  Restless 1 0  Easily annoyed or irritable 3 1  Afraid - awful might happen 2 1  Total GAD 7 Score 16 9  Anxiety Difficulty Very difficult Somewhat difficult    Immunization History  Administered Date(s) Administered   Influenza,inj,Quad PF,6+ Mos 05/18/2018   Past Medical History:  Diagnosis Date   Chicken pox    Depression with anxiety    Fracture of 5th metatarsal 2017   rigth ankle sprain and 5th meatarsal fx- managed with boot.    Hepatitis B 1979   History of colon polyps    Hyperthyroidism 2005   Nuclear rad tx (I131)-- on synthoid   Obsessive-compulsive disorder  Ophthalmic Graves disease 2005   Right shoulder pain 11/12/2017   Normal x-ray.  Mild spurring of AC joint of shoulder.   Allergies  Allergen Reactions   Relafen [Nabumetone] Other (See Comments)    Stomach upset   Past Surgical History:  Procedure Laterality Date   ABDOMINAL HYSTERECTOMY     BLEPHAROPLASTY Bilateral 2005   Secondary from proptosis from ocular Graves' disease.   ORBITAL OSTEOTOMY Bilateral    Ocular Graves' disease   WISDOM TOOTH EXTRACTION      Family History  Problem Relation Age of Onset   Schizophrenia Paternal Grandmother    Arthritis Paternal Grandmother    Drug abuse Brother    Alcohol abuse Brother    Learning disabilities Sister    COPD Mother    Hypertension Mother    Miscarriages / [place] Mother    Lung cancer Mother    Early death Father    Hypertension Father    Pancreatitis Father    Depression Daughter    Alcohol abuse Maternal Grandmother    Hearing loss Maternal Grandmother    Alcoholism Maternal Grandfather    Hearing loss Paternal Grandfather    Stroke Paternal Grandfather    Depression Sister    Alcohol abuse Sister    Social History   Social History Narrative   Marital status/children/pets: Married   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 If you have any questions, ask your nurse or doctor.        ARIPiprazole 2 MG tablet Commonly known as: ABILIFY Take 2 mg by mouth daily.   buPROPion 300 MG 24 hr tablet Commonly known as: WELLBUTRIN XL Take 1 tablet (300 mg total) by mouth daily.   clonazePAM 1 MG tablet Commonly known as: KLONOPIN TAKE 1 TABLET BY MOUTH THREE TIMES DAILY AS NEEDED FOR ANXIETY   levothyroxine 88 MCG tablet Commonly known as: SYNTHROID Take 1 tablet (88 mcg total) by mouth daily before breakfast.       All past medical history, surgical history, allergies, family history, immunizations andmedications were updated in the EMR today and reviewed under the history and medication portions of their EMR.     No results found for this or any previous visit (from the past 2160 hour(s)).  Mm 3d Screen Breast Bilateral  Result  Date: 01/07/2018 CLINICAL DATA:  Screening. EXAM: DIGITAL SCREENING BILATERAL MAMMOGRAM WITH TOMO AND CAD COMPARISON:  Previous exam(s). ACR Breast Density Category b: There are scattered areas of fibroglandular density. FINDINGS: There are no findings suspicious for malignancy. Images were processed with CAD. IMPRESSION: No mammographic evidence of malignancy. A result letter of this screening mammogram will be mailed directly to the patient. RECOMMENDATION: Screening mammogram in one year. (Code:SM-B-01Y) BI-RADS CATEGORY  1: Negative. Electronically Signed   By: Ted Mcalpineobrinka  Dimitrova M.D.   On: 01/07/2018 16:58     ROS: 14 pt review of systems performed and negative (unless mentioned in an HPI)  Objective: BP 100/66 (BP Location: Right Arm, Patient Position: Sitting, Cuff Size: Normal)    Pulse 69  Temp 97.7 F (36.5 C) (Temporal)    Resp 16    Ht 5\' 5"  (1.651 m)    Wt 141 lb 6 oz (64.1 kg)    SpO2 99%    BMI 23.53 kg/m  Gen: Afebrile. No acute distress. Nontoxic in appearance, well-developed, well-nourished,  Pleasant caucasian female.  HENT: AT. [place]. Bilateral TM visualized and normal in appearance, normal external auditory canal. MMM, no oral lesions, adequate dentition. Bilateral nares within normal limits. Throat without erythema, ulcerations or exudates. no Cough on exam, no hoarseness on exam. Eyes:Pupils Equal Round Reactive to light, Extraocular movements intact,  Conjunctiva without redness, discharge or icterus. Neck/lymp/endocrine: Supple,no lymphadenopathy, no thyromegaly CV: RRR no murmur, no edema, +2/4 P posterior tibialis pulses. no carotid bruits. No JVD. Chest: CTAB, no wheeze, rhonchi or crackles. normal Respiratory effort. good Air movement. Abd: Soft. flat. NTND. BS present. no Masses palpated. No hepatosplenomegaly. No rebound tenderness or guarding. Skin: no rashes, purpura or petechiae. Warm and well-perfused. Skin intact. Neuro/Msk: Normal gait. PERLA. EOMi. Alert.  Oriented x3.  Cranial nerves II through XII intact. Muscle strength 5/5 upper/lower extremity. DTRs equal bilaterally. Psych: Normal affect, dress and demeanor. Normal speech. Normal thought content and judgment.   No exam data present  Assessment/plan: Zoua Caporaso is a 58 y.o. female present for CPE Need for hepatitis C screening test - Hepatitis C Antibody Lipid screening - Lipid panel Diabetes mellitus screening - Hemoglobin A1C Screening for deficiency anemia - CBC Breast cancer screening by mammogram - MM 3D SCREEN BREAST BILATERAL; Future Estrogen deficiency - DG Bone Density; Future Encounter for preventative adult health care examination Patient was encouraged to exercise greater than 150 minutes a week. Patient was encouraged to choose a diet filled with fresh fruits and vegetables, and lean meats. AVS provided to patient today for education/recommendation on gender specific health and safety maintenance. Colonoscopy: completed 08/05/2018, [facility] Mammogram: completed:01/2018, birads 1>>>ordered Cervical cancer screening: hysterectomy Immunizations: tdap updated today, Influenza 2019 (encouraged yearly), Shingrix #1 today Infectious disease screening: HIV completed in the past, Hep C  Pt agreeable to testing today.  DEXA: ordered today- caucasian, thin, estrogen def, former smoker, h/o graves disease  Hypothyroidism, unspecified type TSH and free T4 WNL 10/2018.   Will follow yearly with her CPE (if needs an extra refill next year will send in)  Major depressive disorder, recurrent episode, moderate ([place]) She is being managed by psychology.  She reports she is doing much better and feels she is improving with her psychiatry team.  Return in about 1 year (around 02/02/2020) for CPE (30 min). 3 mos for nurse visit shingrix #2  Electronically signed by: Howard Pouch, DO [place]

## 2019-02-02 NOTE — Patient Instructions (Signed)
Health Maintenance, Female Adopting a healthy lifestyle and getting preventive care are important in promoting health and wellness. Ask your health care provider about:  The right schedule for you to have regular tests and exams.  Things you can do on your own to prevent diseases and keep yourself healthy. What should I know about diet, weight, and exercise? Eat a healthy diet   Eat a diet that includes plenty of vegetables, fruits, low-fat dairy products, and lean protein.  Do not eat a lot of foods that are high in solid fats, added sugars, or sodium. Maintain a healthy weight Body mass index (BMI) is used to identify weight problems. It estimates body fat based on height and weight. Your health care provider can help determine your BMI and help you achieve or maintain a healthy weight. Get regular exercise Get regular exercise. This is one of the most important things you can do for your health. Most adults should:  Exercise for at least 150 minutes each week. The exercise should increase your heart rate and make you sweat (moderate-intensity exercise).  Do strengthening exercises at least twice a week. This is in addition to the moderate-intensity exercise.  Spend less time sitting. Even light physical activity can be beneficial. Watch cholesterol and blood lipids Have your blood tested for lipids and cholesterol at 60 years of age, then have this test every 5 years. Have your cholesterol levels checked more often if:  Your lipid or cholesterol levels are high.  You are older than 60 years of age.  You are at high risk for heart disease. What should I know about cancer screening? Depending on your health history and family history, you may need to have cancer screening at various ages. This may include screening for:  Breast cancer.  Cervical cancer.  Colorectal cancer.  Skin cancer.  Lung cancer. What should I know about heart disease, diabetes, and high blood  pressure? Blood pressure and heart disease  High blood pressure causes heart disease and increases the risk of stroke. This is more likely to develop in people who have high blood pressure readings, are of African descent, or are overweight.  Have your blood pressure checked: ? Every 3-5 years if you are 18-39 years of age. ? Every year if you are 40 years old or older. Diabetes Have regular diabetes screenings. This checks your fasting blood sugar level. Have the screening done:  Once every three years after age 40 if you are at a normal weight and have a low risk for diabetes.  More often and at a younger age if you are overweight or have a high risk for diabetes. What should I know about preventing infection? Hepatitis B If you have a higher risk for hepatitis B, you should be screened for this virus. Talk with your health care provider to find out if you are at risk for hepatitis B infection. Hepatitis C Testing is recommended for:  Everyone born from 1945 through 1965.  Anyone with known risk factors for hepatitis C. Sexually transmitted infections (STIs)  Get screened for STIs, including gonorrhea and chlamydia, if: ? You are sexually active and are younger than 60 years of age. ? You are older than 60 years of age and your health care provider tells you that you are at risk for this type of infection. ? Your sexual activity has changed since you were last screened, and you are at increased risk for chlamydia or gonorrhea. Ask your health care provider if   you are at risk.  Ask your health care provider about whether you are at high risk for HIV. Your health care provider may recommend a prescription medicine to help prevent HIV infection. If you choose to take medicine to prevent HIV, you should first get tested for HIV. You should then be tested every 3 months for as long as you are taking the medicine. Pregnancy  If you are about to stop having your period (premenopausal) and  you may become pregnant, seek counseling before you get pregnant.  Take 400 to 800 micrograms (mcg) of folic acid every day if you become pregnant.  Ask for birth control (contraception) if you want to prevent pregnancy. Osteoporosis and menopause Osteoporosis is a disease in which the bones lose minerals and strength with aging. This can result in bone fractures. If you are 65 years old or older, or if you are at risk for osteoporosis and fractures, ask your health care provider if you should:  Be screened for bone loss.  Take a calcium or vitamin D supplement to lower your risk of fractures.  Be given hormone replacement therapy (HRT) to treat symptoms of menopause. Follow these instructions at home: Lifestyle  Do not use any products that contain nicotine or tobacco, such as cigarettes, e-cigarettes, and chewing tobacco. If you need help quitting, ask your health care provider.  Do not use street drugs.  Do not share needles.  Ask your health care provider for help if you need support or information about quitting drugs. Alcohol use  Do not drink alcohol if: ? Your health care provider tells you not to drink. ? You are pregnant, may be pregnant, or are planning to become pregnant.  If you drink alcohol: ? Limit how much you use to 0-1 drink a day. ? Limit intake if you are breastfeeding.  Be aware of how much alcohol is in your drink. In the [place] one drink equals one 12 oz bottle of beer (355 mL), one 5 oz glass of wine (148 mL), or one 1 oz glass of hard liquor (44 mL). General instructions  Schedule regular health, dental, and eye exams.  Stay current with your vaccines.  Tell your health care provider if: ? You often feel depressed. ? You have ever been abused or do not feel safe at home. Summary  Adopting a healthy lifestyle and getting preventive care are important in promoting health and wellness.  Follow your health care provider's instructions about healthy  diet, exercising, and getting tested or screened for diseases.  Follow your health care provider's instructions on monitoring your cholesterol and blood pressure. This information is not intended to replace advice given to you by your health care provider. Make sure you discuss any questions you have with your health care provider. Document Released: 02/03/2011 Document Revised: 07/14/2018 Document Reviewed: 07/14/2018 Elsevier Patient Education  2020 [employer]  

## 2019-02-02 NOTE — Telephone Encounter (Signed)
Pt was called and given lab results, she verbalized understanding.  

## 2019-02-02 NOTE — Telephone Encounter (Signed)
Please inform patient the following information: -Her blood counts look good and she is not anemic. - Her diabetes screen is great with an A1c of 5 4. - Thyroid is functioning normal. - Her electrolytes and liver are functioning normal. -Her cholesterol is good with her bad cholesterol being less than 130, which is her goal. - Her kidneys have a very mild decrease in function.  Making sure to not develop diabetes or hypertension, maintaining routine exercise and a healthy diet will help prevent further kidney dysfunction.   Hepatitis C screening will not be back until next week.  We will call her when it returns.

## 2019-02-03 DIAGNOSIS — F411 Generalized anxiety disorder: Secondary | ICD-10-CM | POA: Diagnosis not present

## 2019-02-03 DIAGNOSIS — F322 Major depressive disorder, single episode, severe without psychotic features: Secondary | ICD-10-CM | POA: Diagnosis not present

## 2019-02-03 DIAGNOSIS — F632 Kleptomania: Secondary | ICD-10-CM | POA: Diagnosis not present

## 2019-02-03 DIAGNOSIS — F909 Attention-deficit hyperactivity disorder, unspecified type: Secondary | ICD-10-CM | POA: Diagnosis not present

## 2019-02-03 LAB — HEPATITIS C ANTIBODY
Hepatitis C Ab: NONREACTIVE
SIGNAL TO CUT-OFF: 0.01 (ref <1.00)

## 2019-02-07 ENCOUNTER — Telehealth (HOSPITAL_COMMUNITY): Payer: Self-pay

## 2019-02-07 NOTE — Telephone Encounter (Signed)
She should increase dose of Abilify to two tablets (ie 4 mg daily). If this does not help with depression she would be a good candidate for [place] (if her insurance covers that and she is willing).

## 2019-02-07 NOTE — Telephone Encounter (Signed)
Patient is calling, she states that the Abilify is not working and she feels more depressed. Please review and advise, patient comes back at the end of the month

## 2019-02-09 ENCOUNTER — Other Ambulatory Visit: Payer: Self-pay | Admitting: Family Medicine

## 2019-02-09 DIAGNOSIS — Z1231 Encounter for screening mammogram for malignant neoplasm of breast: Secondary | ICD-10-CM

## 2019-02-09 DIAGNOSIS — E2839 Other primary ovarian failure: Secondary | ICD-10-CM

## 2019-02-09 DIAGNOSIS — F632 Kleptomania: Secondary | ICD-10-CM | POA: Diagnosis not present

## 2019-02-09 DIAGNOSIS — F411 Generalized anxiety disorder: Secondary | ICD-10-CM | POA: Diagnosis not present

## 2019-02-09 DIAGNOSIS — F322 Major depressive disorder, single episode, severe without psychotic features: Secondary | ICD-10-CM | POA: Diagnosis not present

## 2019-02-09 DIAGNOSIS — F909 Attention-deficit hyperactivity disorder, unspecified type: Secondary | ICD-10-CM | POA: Diagnosis not present

## 2019-02-09 NOTE — Telephone Encounter (Signed)
I called and spoke to the patient about increasing the Abilify. I gave patient info on [place] and she is going to look into it and see if it would be a good fit for her.

## 2019-02-16 DIAGNOSIS — F632 Kleptomania: Secondary | ICD-10-CM | POA: Diagnosis not present

## 2019-02-16 DIAGNOSIS — F909 Attention-deficit hyperactivity disorder, unspecified type: Secondary | ICD-10-CM | POA: Diagnosis not present

## 2019-02-16 DIAGNOSIS — F322 Major depressive disorder, single episode, severe without psychotic features: Secondary | ICD-10-CM | POA: Diagnosis not present

## 2019-02-16 DIAGNOSIS — F411 Generalized anxiety disorder: Secondary | ICD-10-CM | POA: Diagnosis not present

## 2019-03-02 ENCOUNTER — Other Ambulatory Visit: Payer: Self-pay

## 2019-03-02 ENCOUNTER — Ambulatory Visit (INDEPENDENT_AMBULATORY_CARE_PROVIDER_SITE_OTHER): Payer: Medicare Other | Admitting: Psychiatry

## 2019-03-02 DIAGNOSIS — F632 Kleptomania: Secondary | ICD-10-CM | POA: Diagnosis not present

## 2019-03-02 DIAGNOSIS — F331 Major depressive disorder, recurrent, moderate: Secondary | ICD-10-CM | POA: Diagnosis not present

## 2019-03-02 DIAGNOSIS — F411 Generalized anxiety disorder: Secondary | ICD-10-CM | POA: Diagnosis not present

## 2019-03-02 DIAGNOSIS — F1021 Alcohol dependence, in remission: Secondary | ICD-10-CM | POA: Diagnosis not present

## 2019-03-02 MED ORDER — CLONAZEPAM 1 MG PO TABS
1.0000 mg | ORAL_TABLET | Freq: Two times a day (BID) | ORAL | 1 refills | Status: DC | PRN
Start: 2019-03-02 — End: 2019-04-07

## 2019-03-02 MED ORDER — ARIPIPRAZOLE 5 MG PO TABS: 5.0000 mg | ORAL_TABLET | Freq: Every day | ORAL | 1 refills | Status: DC

## 2019-03-02 MED ORDER — SERTRALINE HCL 100 MG PO TABS: 200.0000 mg | ORAL_TABLET | Freq: Every day | ORAL | 1 refills | Status: DC
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
## 2019-03-10 DIAGNOSIS — F321 Major depressive disorder, single episode, moderate: Secondary | ICD-10-CM | POA: Diagnosis not present

## 2019-03-10 DIAGNOSIS — F909 Attention-deficit hyperactivity disorder, unspecified type: Secondary | ICD-10-CM | POA: Diagnosis not present

## 2019-03-10 DIAGNOSIS — F632 Kleptomania: Secondary | ICD-10-CM | POA: Diagnosis not present

## 2019-03-10 DIAGNOSIS — F411 Generalized anxiety disorder: Secondary | ICD-10-CM | POA: Diagnosis not present

## 2019-03-16 DIAGNOSIS — F411 Generalized anxiety disorder: Secondary | ICD-10-CM | POA: Diagnosis not present

## 2019-03-16 DIAGNOSIS — E0590 Thyrotoxicosis, unspecified without thyrotoxic crisis or storm: Secondary | ICD-10-CM | POA: Diagnosis not present

## 2019-03-16 DIAGNOSIS — F632 Kleptomania: Secondary | ICD-10-CM | POA: Diagnosis not present

## 2019-03-16 DIAGNOSIS — F321 Major depressive disorder, single episode, moderate: Secondary | ICD-10-CM | POA: Diagnosis not present

## 2019-03-16 DIAGNOSIS — F909 Attention-deficit hyperactivity disorder, unspecified type: Secondary | ICD-10-CM | POA: Diagnosis not present

## 2019-04-07 ENCOUNTER — Ambulatory Visit (INDEPENDENT_AMBULATORY_CARE_PROVIDER_SITE_OTHER): Payer: Medicare Other | Admitting: Psychiatry

## 2019-04-07 ENCOUNTER — Other Ambulatory Visit: Payer: Self-pay

## 2019-04-07 DIAGNOSIS — F330 Major depressive disorder, recurrent, mild: Secondary | ICD-10-CM | POA: Diagnosis not present

## 2019-04-07 DIAGNOSIS — F411 Generalized anxiety disorder: Secondary | ICD-10-CM

## 2019-04-07 DIAGNOSIS — F1021 Alcohol dependence, in remission: Secondary | ICD-10-CM

## 2019-04-07 DIAGNOSIS — F632 Kleptomania: Secondary | ICD-10-CM

## 2019-04-07 MED ORDER — SERTRALINE HCL 100 MG PO TABS: 200.0000 mg | ORAL_TABLET | Freq: Every day | ORAL | 0 refills | Status: DC

## 2019-04-07 MED ORDER — CLONAZEPAM 1 MG PO TABS: 1.0000 mg | ORAL_TABLET | Freq: Two times a day (BID) | ORAL | 0 refills | Status: DC | PRN

## 2019-04-07 MED ORDER — ARIPIPRAZOLE 5 MG PO TABS: 5.0000 mg | ORAL_TABLET | Freq: Every day | ORAL | 0 refills | Status: DC

## 2019-04-07 MED ORDER — BUPROPION HCL ER (XL) 300 MG PO TB24: 300.0000 mg | ORAL_TABLET | Freq: Every day | ORAL | 0 refills | Status: DC
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
## 2019-04-20 ENCOUNTER — Other Ambulatory Visit: Payer: Federal, State, Local not specified - PPO

## 2019-04-20 ENCOUNTER — Telehealth: Payer: Self-pay | Admitting: Family Medicine

## 2019-04-20 ENCOUNTER — Ambulatory Visit
Admission: RE | Admit: 2019-04-20 | Discharge: 2019-04-20 | Disposition: A | Discharge status: Home / Self Care | Payer: Medicare Other | Source: Ambulatory Visit | Attending: Family Medicine | Admitting: Family Medicine

## 2019-04-20 ENCOUNTER — Other Ambulatory Visit: Payer: Self-pay

## 2019-04-20 DIAGNOSIS — E2839 Other primary ovarian failure: Secondary | ICD-10-CM

## 2019-04-20 DIAGNOSIS — Z1231 Encounter for screening mammogram for malignant neoplasm of breast: Secondary | ICD-10-CM

## 2019-04-20 NOTE — Telephone Encounter (Signed)
Please inform patient the following information: Her bone density resulted with osteopenia or bone softening worse location is her left hip/femur area.  She should start calcium 1200 mg a day and vit d 800 u daily. We will repeat her bone density in 3 years.  - Her mammogram has not been read as of yet- we will call her when we get those results.

## 2019-04-21 ENCOUNTER — Telehealth: Payer: Self-pay | Admitting: Family Medicine

## 2019-04-21 NOTE — Telephone Encounter (Signed)
Pt was called and given information, she verbalized understanding  

## 2019-04-21 NOTE — Telephone Encounter (Signed)
Patient states she was reviewing her appt notes/AVS for her visit on 02/02/19. She states that she doesn't understand a diagnosis. Says she has never been told she had CKD (chronic kidney disease) stage 3, GFR 30-59 ml/min ([place])  Please advise and contact patient [phone].  Thank you

## 2019-04-21 NOTE — Telephone Encounter (Signed)
Called patient and lvm to call me back to discuss bone density study results

## 2019-04-21 NOTE — Telephone Encounter (Signed)
Patient called back and she will start vitamin d and calcium supplement.

## 2019-06-03 ENCOUNTER — Telehealth: Payer: Self-pay | Admitting: Family Medicine

## 2019-06-03 MED ORDER — LEVOTHYROXINE SODIUM 88 MCG PO TABS: 88.0000 ug | ORAL_TABLET | Freq: Every day | ORAL | 2 refills | Status: DC

## 2019-06-03 NOTE — Telephone Encounter (Signed)
RX was sent as 30 day supply with 2 refills. Pt was called with no answer.

## 2019-06-03 NOTE — Telephone Encounter (Signed)
Patient is requesting Rx for Synthroid .088 MG to [facility] 30 day supply. She states 90 day is too expensive.

## 2019-06-23 ENCOUNTER — Other Ambulatory Visit: Payer: Self-pay

## 2019-06-23 ENCOUNTER — Ambulatory Visit (INDEPENDENT_AMBULATORY_CARE_PROVIDER_SITE_OTHER): Payer: Medicare Other | Admitting: Psychiatry

## 2019-06-23 DIAGNOSIS — F1021 Alcohol dependence, in remission: Secondary | ICD-10-CM | POA: Diagnosis not present

## 2019-06-23 DIAGNOSIS — F632 Kleptomania: Secondary | ICD-10-CM

## 2019-06-23 DIAGNOSIS — F411 Generalized anxiety disorder: Secondary | ICD-10-CM

## 2019-06-23 DIAGNOSIS — F330 Major depressive disorder, recurrent, mild: Secondary | ICD-10-CM

## 2019-06-23 MED ORDER — CLONAZEPAM 1 MG PO TABS: 1.0000 mg | ORAL_TABLET | Freq: Two times a day (BID) | ORAL | 0 refills | Status: DC | PRN

## 2019-06-23 MED ORDER — BUPROPION HCL ER (XL) 300 MG PO TB24: 300.0000 mg | ORAL_TABLET | Freq: Every day | ORAL | 0 refills | Status: DC

## 2019-06-23 MED ORDER — SERTRALINE HCL 100 MG PO TABS: 200.0000 mg | ORAL_TABLET | Freq: Every day | ORAL | 0 refills | Status: DC
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
## 2019-07-12 ENCOUNTER — Other Ambulatory Visit: Payer: Self-pay

## 2019-07-12 ENCOUNTER — Telehealth: Payer: Self-pay | Admitting: Family Medicine

## 2019-07-12 MED ORDER — LEVOTHYROXINE SODIUM 88 MCG PO TABS: 88.0000 ug | ORAL_TABLET | Freq: Every day | ORAL | 1 refills | Status: DC

## 2019-07-12 NOTE — Telephone Encounter (Signed)
Patient is requesting levothyroxine (SYNTHROID) 88 MCG tablet to be changed to the generic due to cost. She has already gotten her refill for December. Please call patient back.

## 2019-07-12 NOTE — Telephone Encounter (Signed)
Pharmacy was called and they have been giving medication at name brand. New RX was sent per pharmacy request for generic medication. Medication was sent as Generic last RX. Pt was called and notified pharmacy had been notified.

## 2019-08-12 ENCOUNTER — Ambulatory Visit (INDEPENDENT_AMBULATORY_CARE_PROVIDER_SITE_OTHER): Payer: Medicare Other | Admitting: Family Medicine

## 2019-08-12 ENCOUNTER — Other Ambulatory Visit: Payer: Self-pay

## 2019-08-12 ENCOUNTER — Encounter: Payer: Self-pay | Admitting: Neurology

## 2019-08-12 ENCOUNTER — Encounter: Payer: Self-pay | Admitting: Family Medicine

## 2019-08-12 VITALS — Ht 65.0 in | Wt 147.0 lb

## 2019-08-12 DIAGNOSIS — R2681 Unsteadiness on feet: Secondary | ICD-10-CM

## 2019-08-12 DIAGNOSIS — R251 Tremor, unspecified: Secondary | ICD-10-CM | POA: Diagnosis not present

## 2019-08-12 HISTORY — DX: Unsteadiness on feet: R26.81

## 2019-08-12 NOTE — Progress Notes (Signed)
VIRTUAL VISIT VIA VIDEO  I connected with Katie Rojas on 08/12/19 at  9:00 AM EST by a video enabled telemedicine application and verified that I am speaking with the correct person using two identifiers. Location patient: Home Location provider: [facility], Office Persons participating in the virtual visit: Patient, Dr. Claiborne Billings and R.Baker, LPN  I discussed the limitations of evaluation and management by telemedicine and the availability of in person appointments. The patient expressed understanding and agreed to proceed.   SUBJECTIVE Chief Complaint  Patient presents with  . Tremors    Increase in tremors in hands, head, whole body at times. Pt started a new medication by psych "that helps boost the Welbutrin" Pt started medication 2 months ago.     HPI: Katie Rojas is a 59 y.o. female present today to discuss her tremors.  She states she is had tremors probably for about 10 years.  She has noticed them worsening over the last 5 years.  She states other people have also noticed her movement/tremors.  Patient reports her hand tremors are present at all times whether sitting relaxing or attempting to use hands.  She is unable to stop the tremor with focus.  She reports her head movements are forward backward and are worse with relaxing.  She states she does not notices much when she is up and moving around.  She has had other people comment on her head movements.  And she feels her glasses even move on her face with these movements.  She also endorses feeling a tremor in her body where it feels it is shaking.  She states she feels this more when she is sitting still.  She is having difficulties with her balance.  She feels she is doing all right as far as her balance but she has noticed she has to focus more on her footing.  She states she has torn her ACL this week after falling, however that was when she was jumping over a small creek bed.  She is on multiple  antipsychotic/antidepressant medications.  Including maximum 300 mg, Klonopin 100 mg twice daily and Zoloft 200 mg daily through her psychiatry team.  She also states they added Abilify 5 mg daily approximately 2 months ago.  She has not noticed any increase in her tremor since starting the recent addition of Abilify.  Her recent lab work yielded normal thyroid function, and only mild decreased kidney function.  Her mother had a tremor disorder.  She states her mom's hands would shake.  She has 3 sisters, the oldest of her sisters has a tremor of her hands and her youngest sister's son (her nephew)  has a hand tremor.  ROS: See pertinent positives and negatives per HPI.  Patient Active Problem List   Diagnosis Date Noted  . CKD (chronic kidney disease) stage 3, GFR 30-59 ml/min 02/02/2019  . Graves' disease in remission 11/05/2018  . Hypothyroidism 10/21/2018  . Alcohol use disorder, severe, in early remission, dependence (HCC) 10/19/2018  . Major depressive disorder, recurrent episode, mild (HCC) 09/21/2018  . GAD (generalized anxiety disorder) 09/21/2018  . 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 Smoking status: Former Smoker    Types: Cigarettes    Quit date: 09/22/2003    Years since quitting: 15.8  . Smokeless tobacco: Never Used  Substance Use Topics  . Alcohol use: Not Currently    Alcohol/week: 3.0 - 4.0 standard drinks  Types: 3 - 4 Glasses of wine per week    Comment: Patient quit 02/20    Current Outpatient Medications:  .  buPROPion (WELLBUTRIN XL) 300 MG 24 hr tablet, Take 1 tablet (300 mg total) by mouth daily., Disp: 90 tablet, Rfl: 0 .  clonazePAM (KLONOPIN) 1 MG tablet, Take 1 tablet (1 mg total) by mouth 2 (two) times daily as needed for anxiety., Disp: 180 tablet, Rfl: 0 .  levothyroxine (SYNTHROID) 88 MCG tablet, Take 1 tablet (88 mcg total) by mouth daily before breakfast. GENERIC OKAY, Disp: 30 tablet, Rfl: 1 .  sertraline (ZOLOFT)  100 MG tablet, Take 2 tablets (200 mg total) by mouth daily., Disp: 180 tablet, Rfl: 0  Allergies  Allergen Reactions  . Relafen [Nabumetone] Other (See Comments)    Stomach upset    OBJECTIVE: Ht 5\' 5"  (1.651 m)   Wt 147 lb (66.7 kg)   BMI 24.46 kg/m  Gen: No acute distress. Nontoxic in appearance.  HENT: AT. [place].  MMM.  Eyes:Pupils Equal Round Reactive to light, Extraocular movements intact,  Conjunctiva without redness, discharge or icterus. Neuro: Alert. Oriented x3.  Mild shaking of hands appreciated during visit.  Unable to visualize head movement-picture quality could have limited visualization. Psych: Normal affect, dress and demeanor. Normal speech. Normal thought content and judgment.  ASSESSMENT AND PLAN: Katie Rojas is a 59 y.o. female present for  Tremor/gait instability -This is a new problem to this provider.  This is a chronic worsening problem for the patient.  Lengthy discussion today surrounding the nature of her movements and there does appear to be a family history of tremor, however her family numbers tremor are all only affecting their hands. -Discussed some of her medications may be playing a role in her symptoms as well or even her prior alcohol use disorder that is now in remission. -We discussed referral to a movement disorder specialist and she would like to proceed with that today. - Ambulatory referral to Neurology   Orders Placed This Encounter  Procedures  . Ambulatory referral to Neurology     [facility], DO 08/12/2019

## 2019-08-12 NOTE — Patient Instructions (Signed)

## 2019-08-23 ENCOUNTER — Ambulatory Visit: Payer: Medicare Other | Admitting: Family Medicine

## 2019-08-31 DIAGNOSIS — S82142A Displaced bicondylar fracture of left tibia, initial encounter for closed fracture: Secondary | ICD-10-CM | POA: Insufficient documentation

## 2019-08-31 HISTORY — DX: Displaced bicondylar fracture of left tibia, initial encounter for closed fracture: S82.142A

## 2019-09-01 ENCOUNTER — Ambulatory Visit (INDEPENDENT_AMBULATORY_CARE_PROVIDER_SITE_OTHER): Payer: Medicare Other | Admitting: Family Medicine

## 2019-09-01 ENCOUNTER — Telehealth: Payer: Self-pay | Admitting: Family Medicine

## 2019-09-01 ENCOUNTER — Encounter: Payer: Self-pay | Admitting: Family Medicine

## 2019-09-01 ENCOUNTER — Other Ambulatory Visit: Payer: Self-pay

## 2019-09-01 VITALS — BP 129/74 | HR 68 | Temp 97.1°F | Resp 16 | Ht 65.0 in | Wt 157.4 lb

## 2019-09-01 DIAGNOSIS — R5383 Other fatigue: Secondary | ICD-10-CM | POA: Diagnosis not present

## 2019-09-01 DIAGNOSIS — R635 Abnormal weight gain: Secondary | ICD-10-CM | POA: Diagnosis not present

## 2019-09-01 DIAGNOSIS — K5900 Constipation, unspecified: Secondary | ICD-10-CM | POA: Diagnosis not present

## 2019-09-01 DIAGNOSIS — E039 Hypothyroidism, unspecified: Secondary | ICD-10-CM

## 2019-09-01 DIAGNOSIS — L679 Hair color and hair shaft abnormality, unspecified: Secondary | ICD-10-CM

## 2019-09-01 DIAGNOSIS — N1831 Chronic kidney disease, stage 3a: Secondary | ICD-10-CM | POA: Diagnosis not present

## 2019-09-01 LAB — TSH: TSH: 11.47 u[IU]/mL — ABNORMAL HIGH (ref 0.35–4.50)

## 2019-09-01 LAB — COMPREHENSIVE METABOLIC PANEL
ALT: 16 U/L (ref 0–35)
AST: 16 U/L (ref 0–37)
Albumin: 4.1 g/dL (ref 3.5–5.2)
Alkaline Phosphatase: 72 U/L (ref 39–117)
BUN: 20 mg/dL (ref 6–23)
CO2: 30 mEq/L (ref 19–32)
Calcium: 9.2 mg/dL (ref 8.4–10.5)
Chloride: 105 mEq/L (ref 96–112)
Creatinine, Ser: 1.11 mg/dL (ref 0.40–1.20)
GFR: 50.03 mL/min — ABNORMAL LOW (ref >60.00)
Glucose, Bld: 88 mg/dL (ref 70–99)
Potassium: 4.4 mEq/L (ref 3.5–5.1)
Sodium: 139 mEq/L (ref 135–145)
Total Bilirubin: 0.3 mg/dL (ref 0.2–1.2)
Total Protein: 6.3 g/dL (ref 6.0–8.3)

## 2019-09-01 LAB — CBC
HCT: 37 % (ref 36.0–46.0)
Hemoglobin: 12.4 g/dL (ref 12.0–15.0)
MCHC: 33.5 g/dL (ref 30.0–36.0)
MCV: 95.6 fl (ref 78.0–100.0)
Platelets: 329 10*3/uL (ref 150.0–400.0)
RBC: 3.88 Mil/uL (ref 3.87–5.11)
RDW: 13.5 % (ref 11.5–15.5)
WBC: 4.6 10*3/uL (ref 4.0–10.5)

## 2019-09-01 LAB — T4, FREE: Free T4: 0.65 ng/dL (ref 0.60–1.60)

## 2019-09-01 LAB — T3, FREE: T3, Free: 2.7 pg/mL (ref 2.3–4.2)

## 2019-09-01 LAB — VITAMIN D 25 HYDROXY (VIT D DEFICIENCY, FRACTURES): VITD: 12.81 ng/mL — ABNORMAL LOW (ref 30.00–100.00)

## 2019-09-01 MED ORDER — VITAMIN D (ERGOCALCIFEROL) 1.25 MG (50000 UNIT) PO CAPS: 50000.0000 [IU] | ORAL_CAPSULE | ORAL | 0 refills | Status: DC

## 2019-09-01 MED ORDER — LEVOTHYROXINE SODIUM 100 MCG PO TABS: 100.0000 ug | ORAL_TABLET | Freq: Every day | ORAL | 0 refills | Status: DC

## 2019-09-01 NOTE — Telephone Encounter (Signed)
Pt was called and given all instructions and was scheduled for F/U appt

## 2019-09-01 NOTE — Patient Instructions (Addendum)
Start sugar free snacks, instead of candy. Try to avoid sugar, pasta, bread, white rice, white potatoes. You Can consume whole wheat versions of the above.  Exercise 30 min a day.  Increase fiber, vegetables and lean meats (fish/chicken).  Add b12 supplement 1000 mcg a day. This helps reduce fatigue.  Start miralax 1/2 cap a day.   We will call you with labs once completed.      COVID-19 Vaccine Information can be found at: [email or web address] For questions related to vaccine distribution or appointments, please email [email or web address] or call [phone].  Covid Vaccine appointment go to [email or web address].   Calorie Counting for Weight Loss Calories are units of energy. Your body needs a certain amount of calories from food to keep you going throughout the day. When you eat more calories than your body needs, your body stores the extra calories as fat. When you eat fewer calories than your body needs, your body burns fat to get the energy it needs. Calorie counting means keeping track of how many calories you eat and drink each day. Calorie counting can be helpful if you need to lose weight. If you make sure to eat fewer calories than your body needs, you should lose weight. Ask your health care provider what a healthy weight is for you. For calorie counting to work, you will need to eat the right number of calories in a day in order to lose a healthy amount of weight per week. A dietitian can help you determine how many calories you need in a day and will give you suggestions on how to reach your calorie goal.  A healthy amount of weight to lose per week is usually 1-2 lb (0.5-0.9 kg). This usually means that your daily calorie intake should be reduced by 500-750 calories.  Eating 1,200 - 1,500 calories per day can help most women lose weight.  Eating 1,500 - 1,800 calories per day can help most men lose weight. What  is my plan? My goal is to have __________ calories per day. If I have this many calories per day, I should lose around __________ pounds per week. What do I need to know about calorie counting? In order to meet your daily calorie goal, you will need to:  Find out how many calories are in each food you would like to eat. Try to do this before you eat.  Decide how much of the food you plan to eat.  Write down what you ate and how many calories it had. Doing this is called keeping a food log. To successfully lose weight, it is important to balance calorie counting with a healthy lifestyle that includes regular activity. Aim for 150 minutes of moderate exercise (such as walking) or 75 minutes of vigorous exercise (such as running) each week. Where do I find calorie information?  The number of calories in a food can be found on a Nutrition Facts label. If a food does not have a Nutrition Facts label, try to look up the calories online or ask your dietitian for help. Remember that calories are listed per serving. If you choose to have more than one serving of a food, you will have to multiply the calories per serving by the amount of servings you plan to eat. For example, the label on a package of bread might say that a serving size is 1 slice and that there are 90 calories in a serving. If you eat 1 slice, you will have eaten 90 calories. If  you eat 2 slices, you will have eaten 180 calories. How do I keep a food log? Immediately after each meal, record the following information in your food log:  What you ate. Don't forget to include toppings, sauces, and other extras on the food.  How much you ate. This can be measured in cups, ounces, or number of items.  How many calories each food and drink had.  The total number of calories in the meal. Keep your food log near you, such as in a small notebook in your pocket, or use a mobile app or website. Some programs will calculate calories for you and  show you how many calories you have left for the day to meet your goal. What are some calorie counting tips?   Use your calories on foods and drinks that will fill you up and not leave you hungry: ? Some examples of foods that fill you up are nuts and nut butters, vegetables, lean proteins, and high-fiber foods like whole grains. High-fiber foods are foods with more than 5 g fiber per serving. ? Drinks such as sodas, specialty coffee drinks, alcohol, and juices have a lot of calories, yet do not fill you up.  Eat nutritious foods and avoid empty calories. Empty calories are calories you get from foods or beverages that do not have many vitamins or protein, such as candy, sweets, and soda. It is better to have a nutritious high-calorie food (such as an avocado) than a food with few nutrients (such as a bag of chips).  Know how many calories are in the foods you eat most often. This will help you calculate calorie counts faster.  Pay attention to calories in drinks. Low-calorie drinks include water and unsweetened drinks.  Pay attention to nutrition labels for "low fat" or "fat free" foods. These foods sometimes have the same amount of calories or more calories than the full fat versions. They also often have added sugar, starch, or salt, to make up for flavor that was removed with the fat.  Find a way of tracking calories that works for you. Get creative. Try different apps or programs if writing down calories does not work for you. What are some portion control tips?  Know how many calories are in a serving. This will help you know how many servings of a certain food you can have.  Use a measuring cup to measure serving sizes. You could also try weighing out portions on a kitchen scale. With time, you will be able to estimate serving sizes for some foods.  Take some time to put servings of different foods on your favorite plates, bowls, and cups so you know what a serving looks like.  Try  not to eat straight from a bag or box. Doing this can lead to overeating. Put the amount you would like to eat in a cup or on a plate to make sure you are eating the right portion.  Use smaller plates, glasses, and bowls to prevent overeating.  Try not to multitask (for example, watch TV or use your computer) while eating. If it is time to eat, sit down at a table and enjoy your food. This will help you to know when you are full. It will also help you to be aware of what you are eating and how much you are eating. What are tips for following this plan? Reading food labels  Check the calorie count compared to the serving size. The serving size may be  smaller than what you are used to eating.  Check the source of the calories. Make sure the food you are eating is high in vitamins and protein and low in saturated and trans fats. Shopping  Read nutrition labels while you shop. This will help you make healthy decisions before you decide to purchase your food.  Make a grocery list and stick to it. Cooking  Try to cook your favorite foods in a healthier way. For example, try baking instead of frying.  Use low-fat dairy products. Meal planning  Use more fruits and vegetables. Half of your plate should be fruits and vegetables.  Include lean proteins like poultry and fish. How do I count calories when eating out?  Ask for smaller portion sizes.  Consider sharing an entree and sides instead of getting your own entree.  If you get your own entree, eat only half. Ask for a box at the beginning of your meal and put the rest of your entree in it so you are not tempted to eat it.  If calories are listed on the menu, choose the lower calorie options.  Choose dishes that include vegetables, fruits, whole grains, low-fat dairy products, and lean protein.  Choose items that are boiled, broiled, grilled, or steamed. Stay away from items that are buttered, battered, fried, or served with cream  sauce. Items labeled "crispy" are usually fried, unless stated otherwise.  Choose water, low-fat milk, unsweetened iced tea, or other drinks without added sugar. If you want an alcoholic beverage, choose a lower calorie option such as a glass of wine or light beer.  Ask for dressings, sauces, and syrups on the side. These are usually high in calories, so you should limit the amount you eat.  If you want a salad, choose a garden salad and ask for grilled meats. Avoid extra toppings like bacon, cheese, or fried items. Ask for the dressing on the side, or ask for olive oil and vinegar or lemon to use as dressing.  Estimate how many servings of a food you are given. For example, a serving of cooked rice is  cup or about the size of half a baseball. Knowing serving sizes will help you be aware of how much food you are eating at restaurants. The list below tells you how big or small some common portion sizes are based on everyday objects: ? 1 oz--4 stacked dice. ? 3 oz--1 deck of cards. ? 1 tsp--1 die. ? 1 Tbsp-- a ping-pong ball. ? 2 Tbsp--1 ping-pong ball. ?  cup-- baseball. ? 1 cup--1 baseball. Summary  Calorie counting means keeping track of how many calories you eat and drink each day. If you eat fewer calories than your body needs, you should lose weight.  A healthy amount of weight to lose per week is usually 1-2 lb (0.5-0.9 kg). This usually means reducing your daily calorie intake by 500-750 calories.  The number of calories in a food can be found on a Nutrition Facts label. If a food does not have a Nutrition Facts label, try to look up the calories online or ask your dietitian for help.  Use your calories on foods and drinks that will fill you up, and not on foods and drinks that will leave you hungry.  Use smaller plates, glasses, and bowls to prevent overeating. This information is not intended to replace advice given to you by your health care provider. Make sure you discuss  any questions you have with your health care  provider. Document Revised: 04/09/2018 Document Reviewed: 06/20/2016 Elsevier Patient Education  2020 [employer].

## 2019-09-01 NOTE — Progress Notes (Signed)
This visit occurred during the SARS-CoV-2 public health emergency.  Safety protocols were in place, including screening questions prior to the visit, additional usage of staff PPE, and extensive cleaning of exam room while observing appropriate contact time as indicated for disinfecting solutions.    Katie Rojas , August 22, 1960, 59 y.o., female MRN: [id number] Patient Care Team    Relationship Specialty Notifications Start End  Ma Hillock, DO PCP - General Family Medicine  10/21/18   Stephanie Acre, MD Consulting Physician Psychiatry  10/21/18   Gastroenterology, [facility]    10/22/18     Chief Complaint  Patient presents with  . Weight Gain    Pt has gained about 10 lbs since last visit. Pt is eating more sweets than usual due to stress. Pt does not do structured exercise. Pt does not feel like doing anything and has to force herself. Has fatigue. Hair and nails are getting dry and thinning     Subjective: Pt presents for an OV with complaints of weight gain over the last few months of 10 pounds.  She does endorse eating more sweets and sugary snacks.  She states she does not routinely exercise.  She has a recent orthopedic injury of her lower extremity preventing her from exercising.  She endorses increased fatigue.  She also states her hair and nails feels like they are getting dry and thin.  By EMR review she has gained 18 pounds over the past year.  Patient endorses constipation.  She states she sometimes goes without a bowel movement for a week.  She is prescribed Zoloft, Klonopin, Wellbutrin and Abilify through her psychiatry team.  Abilify was recently added over the last few months.  She also has hypothyroid condition in which she reports compliance with levothyroxine 88 mcg daily on an empty stomach.  Her last thyroid levels were completed last summer and were normal.  Depression screen [facility] 2/9 02/02/2019 10/21/2018  Decreased Interest 2 2  Down, Depressed, Hopeless 3 2   PHQ - 2 Score 5 4  Altered sleeping 1 2  Tired, decreased energy 2 1  Change in appetite 2 2  Feeling bad or failure about yourself  3 2  Trouble concentrating 2 2  Moving slowly or fidgety/restless 0 0  Suicidal thoughts 1 0  PHQ-9 Score 16 13  Difficult doing work/chores Very difficult Somewhat difficult    Allergies  Allergen Reactions  . 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 Chicken pox   . Depression with anxiety   . Fracture of 5th metatarsal 2017   rigth ankle sprain and 5th meatarsal fx- managed with boot.   . Hepatitis B 1979  . History of colon polyps   . Hyperthyroidism 2005   Nuclear rad tx (I131)-- on synthoid  . Obsessive-compulsive disorder   . Ophthalmic Graves disease 2005  . Right shoulder pain 11/12/2017   Normal x-ray.  Mild spurring of AC joint of shoulder.   Past Surgical History:  Procedure Laterality Date  . ABDOMINAL HYSTERECTOMY    . BLEPHAROPLASTY Bilateral 2005   Secondary from proptosis  from ocular Graves' disease.  . ORBITAL OSTEOTOMY Bilateral    Ocular Graves' disease  . WISDOM TOOTH EXTRACTION     Family History  Problem Relation Age of Onset  . Schizophrenia Paternal Grandmother   . Arthritis Paternal Grandmother   . Drug abuse Brother   . Alcohol abuse Brother   . Learning disabilities Sister   . COPD Mother   . Hypertension Mother   . Miscarriages / [place] Mother   . Lung cancer Mother   . Tremor Mother        bilateral hands  . Early death Father   . Hypertension Father   . Pancreatitis Father   . Depression Daughter   . Alcohol abuse Maternal Grandmother   . Hearing loss Maternal  Grandmother   . Alcoholism Maternal Grandfather   . Hearing loss Paternal Grandfather   . Stroke Paternal Grandfather   . Depression Sister   . Tremor Sister        left hand- oldest of her sisters  . Alcohol abuse Sister   . Tremor Nephew        hand; youngest sisters son   Allergies as of 09/01/2019      Reactions   Relafen [nabumetone] Other (See Comments)   Stomach upset      Medication List       Accurate as of September 01, 2019  1:08 PM. If you have any questions, ask your nurse or doctor.        ARIPiprazole 5 MG tablet Commonly known as: ABILIFY Take 5 mg by mouth daily.   buPROPion 300 MG 24 hr tablet Commonly known as: WELLBUTRIN XL Take 1 tablet (300 mg total) by mouth daily.   clonazePAM 1 MG tablet Commonly known as: KLONOPIN Take 1 tablet (1 mg total) by mouth 2 (two) times daily as needed for anxiety.   levothyroxine 88 MCG tablet Commonly known as: SYNTHROID Take 1 tablet (88 mcg total) by mouth daily before breakfast. GENERIC OKAY   sertraline 100 MG tablet Commonly known as: ZOLOFT Take 2 tablets (200 mg total) by mouth daily.       All past medical history, surgical history, allergies, family history, immunizations andmedications were updated in the EMR today and reviewed under the history and medication portions of their EMR.     ROS: Negative, with the exception of above mentioned in HPI   Objective:  BP 129/74 (BP Location: Left Arm, Patient Position: Sitting, Cuff Size: Normal)   Pulse 68   Temp (!) 97.1 F (36.2 C) (Temporal)   Resp 16   Ht 5' 5"  (1.651 m)   Wt 157 lb 6 oz (71.4 kg)   SpO2 100%   BMI 26.19 kg/m  Body mass index is 26.19 kg/m. Gen: Afebrile. No acute distress. Nontoxic in appearance, well developed, well nourished. overweight HENT: AT. [place].  Eyes:Pupils Equal Round Reactive to light, Extraocular movements intact,  Conjunctiva without redness, discharge or icterus. Neck/lymp/endocrine: Supple,no lymphadenopathy  CV: RRR, no edema Chest: CTAB, no wheeze or crackles. Good air movement, normal resp effort.  Neuro:  Alert. Oriented x3  Psych: Normal affect, dress and demeanor. Normal speech. Normal thought content and judgment.  No exam data present No results found. No results found for this or any previous visit (from the past 24 hour(s)).  Assessment/Plan: Katie Rojas is a 59 y.o. female present for OV for  Weight gain/fatigue/thinning hair and nails/constipation/hypothyroidism Patient was counseled on decreasing unhealthy carbohydrates within her diet.  She endorses eating many sugary snacks including cookies daily.  She was educated on more healthy options/food choices with lean meat/fish, higher fiber with fresh fruits and vegetables.  Avoidance of high sugary snacks.   -Encouraged her to speak to her psychiatry team about her medications.  If all labs are normal and do not indicate other cause for her weight gain, some of her medications may be contributing to her weight gain.  Advised her to not stop her medications without talking to her psychiatrist first.  Advised her changing her diet would be a first step since she has identified eating higher calorie/sugary snacks, than changing a medication that is working well for her.  She reports understanding. -Exercise is a barrier at this time secondary to orthopedic injury.  She could focus on upper body exercises. - Consider sugar-free products. -Recheck thyroid levels today to ensure still adequately replaced - TSH - T4, free - T3, free - Comp Met (CMET) - CBC  Stage 3a chronic kidney disease -She has stage III chronic kidney disease on last check over the summer 2020.  We will recheck her levels today along with vitamin D.  May need to take in consideration worsening kidney disease and clearance of her medications if appropriate they will need to be renally dosed. - Comp Met (CMET) - Vitamin D (25 hydroxy)  Follow-up dependent upon  lab results   Reviewed expectations re: course of current medical issues.  Discussed self-management of symptoms.  Outlined signs and symptoms indicating need for more acute intervention.  Patient verbalized understanding and all questions were answered.  Patient received an After-Visit Summary.    Orders Placed This Encounter  Procedures  . Comp Met (CMET)  . CBC  . TSH  . T4, free  . T3, free  . Vitamin D (25 hydroxy)  No orders of the defined types were placed in this encounter.  Referral Orders  No referral(s) requested today    Note is dictated utilizing voice recognition software. Although note has been proof read prior to signing, occasional typographical errors still can be missed. If any questions arise, please do not hesitate to call for verification.   electronically signed by:  Howard Pouch, DO  [place]

## 2019-09-01 NOTE — Telephone Encounter (Signed)
Please inform patient the following information: Please inform patient her thyroid levels are abnormal.  I am increasing her thyroid pill>> please have her start the new dose ASAP.  Her vitamin D is also extremely low at 12.  This level should be up near 40-50.  I have called in a once weekly vitamin D supplementation.  Follow-up in 8 weeks with provider, please schedule this now.  And we will also repeat the labs at that time to make sure her levels were in normal range.

## 2019-09-02 NOTE — Progress Notes (Signed)
Katie Rojas was seen today in the movement disorders clinic for neurologic consultation at the request of Kuneff, Renee A, DO.  The consultation is for the evaluation of tremor.  Outside records that were made available to me were reviewed.  Pt is a 59 y/o female with h/o alcoholism per records, MDD, hypothyroidism, CKD, GAD who presents for eval of tremor.  Tremor: Yes.     How long has it been going on? 7-8 years, getting worse with time  At rest or with activation?  both  Fam hx of tremor?  Yes.  , mother, sister shakes in L hand, nephew (moms side of family)  Located where?  Head and hands (started in both at same time); both hands shake equally  Affected by caffeine:  No. (2 coffee/day)  Affected by alcohol: "it relaxes me" - "I don't have a good stopping point when I drink alcohol so its hard to tell effect on tremor" (1 time per month per patient - "I'm trying to lay off of it")  Affected by stress:  Yes.    Affected by fatigue:  unknown  Spills soup if on spoon:  No.  Spills glass of liquid if full:  No.  Affects ADL's (tying shoes, brushing teeth, etc):  No.  Tremor inducing meds: started abilify 2 months ago but pt states that she stopped it this week because of weight gain.  Denies worsening of tremor since starting that  Other Specific Symptoms:  Voice: no change Sleep: trouble maintaining sleep Wet Pillows: Yes.  , drooling at night Postural symptoms:  Yes.    Falls?  Yes.   over the last 3-4 years.  She did just jump over a creek and tore meniscus in the knee and hairline fx of the tibia.   Loss of smell:  No. Loss of taste:  No. Urinary Incontinence:  No. Difficulty Swallowing:  Some getting choked on saliva but not food.  Occasional trouble with meds Trouble with ADL's:  No.  Trouble buttoning clothing: No. N/V:  No. Lightheaded:  No. unless she has blood drawn and then has vasovagal events  Neuroimaging of the brain has not previously been performed in the  recent years.     ALLERGIES:   Allergies  Allergen Reactions  . Relafen [Nabumetone] Other (See Comments)    Stomach upset    CURRENT MEDICATIONS:  Current Outpatient Medications  Medication Instructions  . buPROPion (WELLBUTRIN XL) 300 mg, Oral, Daily  . clonazePAM (KLONOPIN) 1 mg, Oral, 2 times daily PRN  . levothyroxine (SYNTHROID) 100 mcg, Oral, Daily before breakfast, GENERIC OKAY  . sertraline (ZOLOFT) 200 mg, Oral, Daily  . Vitamin D (Ergocalciferol) (DRISDOL) 50,000 Units, Oral, Every 7 days    PHYSICAL EXAMINATION:    VITALS:   Vitals:   09/06/19 0936  BP: 112/66  Pulse: 71  Resp: 16  SpO2: 99%  Weight: 154 lb 6.4 oz (70 kg)  Height: 5\' 5"  (1.651 m)    GEN:  The patient appears stated age and is in NAD. HEENT:  Normocephalic, atraumatic.  The mucous membranes are moist. The superficial temporal arteries are without ropiness or tenderness. CV:  RRR Lungs:  CTAB Neck/HEME:  There are no carotid bruits bilaterally.  Neurological examination:  Orientation: The patient is alert and oriented x3.  Cranial nerves: There is good facial symmetry.  Extraocular muscles are intact. The visual fields are full to confrontational testing. The speech is fluent and clear. Soft palate rises symmetrically and  there is no tongue deviation. Hearing is intact to conversational tone. Sensation: Sensation is intact to light touch throughout (facial, trunk, extremities). Vibration is intact at the bilateral big toe. There is no extinction with double simultaneous stimulation.  Motor: Strength is 5/5 in the bilateral upper and lower extremities.   Shoulder shrug is equal and symmetric.  There is no pronator drift. Deep tendon reflexes: Deep tendon reflexes are 2/4 at the bilateral biceps, triceps, brachioradialis, 3/4 at the bilateral patella and achilles. Plantar responses are downgoing bilaterally.  Movement examination: Tone: There is normal tone in the UE/LE Abnormal movements: no  rest tremor.  There is postural and intention tremor, L > R.  she has mild difficulty with archimedes spirals.  She doesn't spill water when pouring from one glass to another but tremor is evident.  There is some entrainment.  She does have some head tremor that is intermittent, mostly in the no direction. Coordination:  There is no decremation with RAM's Gait and Station: The patient has no difficulty arising out of a deep-seated chair without the use of the hands. The patient's stride length is good but she has foot drop on the L.   I have reviewed and interpreted the following labs independently   Lab Results  Component Value Date   TSH 11.47 (H) 09/01/2019     Chemistry      Component Value Date/Time   NA 139 09/01/2019 0936   K 4.4 09/01/2019 0936   CL 105 09/01/2019 0936   CO2 30 09/01/2019 0936   BUN 20 09/01/2019 0936   BUN 14 12/09/2017 0000   CREATININE 1.11 09/01/2019 0936   GLU 77 12/09/2017 0000      Component Value Date/Time   CALCIUM 9.2 09/01/2019 0936   ALKPHOS 72 09/01/2019 0936   AST 16 09/01/2019 0936   ALT 16 09/01/2019 0936   BILITOT 0.3 09/01/2019 0936     No results found for: VITAMINB12    ASSESSMENT/PLAN:  1.  Tremor  -Likely does have essential tremor.  This may have been exacerbated by Abilify.  She has only been off of it for about a week.  While the effects of this drug can last up to 6 months, she was only on it for about 2 months.  -Long discussion with the patient regarding the complex effect that alcohol can have on tremor.  Chronic alcohol use can produce a tremor but discontinuation of the alcohol can also cause a tremulous state for quite some time.  We talked about the importance of weaning alcohol under medical supervision.  We talked about the fact that rapid discontinuation can cause withdrawal seizure, which is why medical supervision is of the upmost importance.  Her records indicate that she was completely free of alcohol since 2020,  but that is not what she indicated to me today.    -We did talk about medications for tremor, along with a lengthy discussion about risks and benefits.  Talk to her about the fact that it would not help head tremor.  She would like to try primidone, 50 mg nightly.  Discussed risk benefits, and side effects.  Understanding was expressed.  2.  Balance change/hyperreflexia, ?L foot drop  -we will proceed with emg  -labs including b12, b1, spep/upep with immunfix, rpr  -If we do not see anything on this, we may need to proceed with MRI of the lumbar spine.  I did not see anything that looked myelopathic today.  Total time  spent on today's visit was  60 minutes, including both face-to-face time and nonface-to-face time.  Time included that spent on review of records (prior notes available to me/labs/imaging if pertinent), discussing treatment and goals, answering patient's questions and coordinating care.  Cc:  Kuneff, Renee A, DO

## 2019-09-06 ENCOUNTER — Other Ambulatory Visit: Payer: Medicare Other

## 2019-09-06 ENCOUNTER — Ambulatory Visit (INDEPENDENT_AMBULATORY_CARE_PROVIDER_SITE_OTHER): Payer: Medicare Other | Admitting: Neurology

## 2019-09-06 ENCOUNTER — Encounter: Payer: Self-pay | Admitting: Neurology

## 2019-09-06 ENCOUNTER — Other Ambulatory Visit: Payer: Self-pay

## 2019-09-06 VITALS — BP 112/66 | HR 71 | Resp 16 | Ht 65.0 in | Wt 154.4 lb

## 2019-09-06 DIAGNOSIS — M21372 Foot drop, left foot: Secondary | ICD-10-CM

## 2019-09-06 DIAGNOSIS — F1010 Alcohol abuse, uncomplicated: Secondary | ICD-10-CM

## 2019-09-06 DIAGNOSIS — R292 Abnormal reflex: Secondary | ICD-10-CM | POA: Diagnosis not present

## 2019-09-06 DIAGNOSIS — G609 Hereditary and idiopathic neuropathy, unspecified: Secondary | ICD-10-CM

## 2019-09-06 DIAGNOSIS — R6889 Other general symptoms and signs: Secondary | ICD-10-CM

## 2019-09-06 DIAGNOSIS — G250 Essential tremor: Secondary | ICD-10-CM

## 2019-09-06 MED ORDER — PRIMIDONE 50 MG PO TABS: 50.0000 mg | ORAL_TABLET | Freq: Every day | ORAL | 1 refills | Status: DC

## 2019-09-06 NOTE — Patient Instructions (Addendum)
Start primidone 50 mg - 1/2 tablet at bedtime for 1 week and then increase to 1 tablet at bedtime thereafter.  Your provider has requested that you have labwork completed today. Please go to [facility] ([id number]) on the second floor of this building before leaving the office today. You do not need to check in. If you are not called within 15 minutes please check with the front desk.

## 2019-09-09 LAB — PROTEIN ELECTRO, RANDOM URINE
Albumin ELP, Urine: 0 %
Alpha-1-Globulin, U: 0 %
Alpha-2-Globulin, U: 0 %
Beta Globulin, U: 0 %
Gamma Globulin, U: 0 %
Protein, Ur: 4.4 mg/dL

## 2019-09-09 LAB — IMMUNOFIXATION, URINE

## 2019-09-09 LAB — IMMUNOFIXATION, SERUM
IgA/Immunoglobulin A, Serum: 233 mg/dL (ref 87–352)
IgG (Immunoglobin G), Serum: 775 mg/dL (ref 586–1602)
IgM (Immunoglobulin M), Srm: 112 mg/dL (ref 26–217)

## 2019-09-09 LAB — SPECIMEN STATUS REPORT

## 2019-09-10 LAB — PROTEIN ELECTROPHORESIS, SERUM
Albumin ELP: 4.3 g/dL (ref 3.8–4.8)
Alpha 1: 0.3 g/dL (ref 0.2–0.3)
Alpha 2: 0.7 g/dL (ref 0.5–0.9)
Beta 2: 0.4 g/dL (ref 0.2–0.5)
Beta Globulin: 0.4 g/dL (ref 0.4–0.6)
Gamma Globulin: 0.8 g/dL (ref 0.8–1.7)
Total Protein: 6.8 g/dL (ref 6.1–8.1)

## 2019-09-10 LAB — VITAMIN B1: Vitamin B1 (Thiamine): 28 nmol/L (ref 8–30)

## 2019-09-10 LAB — VITAMIN B12: Vitamin B-12: 369 pg/mL (ref 200–1100)

## 2019-09-10 LAB — RPR: RPR Ser Ql: NONREACTIVE

## 2019-09-22 ENCOUNTER — Other Ambulatory Visit: Payer: Self-pay

## 2019-09-22 ENCOUNTER — Ambulatory Visit (HOSPITAL_COMMUNITY): Payer: Medicare Other | Admitting: Psychiatry

## 2019-09-22 MED ORDER — CLONAZEPAM 1 MG PO TABS: 1.0000 mg | ORAL_TABLET | Freq: Two times a day (BID) | ORAL | 0 refills | Status: DC | PRN

## 2019-09-22 MED ORDER — BUPROPION HCL ER (XL) 300 MG PO TB24: 300.0000 mg | ORAL_TABLET | Freq: Every day | ORAL | 0 refills | Status: DC

## 2019-09-22 MED ORDER — SERTRALINE HCL 100 MG PO TABS: 200.0000 mg | ORAL_TABLET | Freq: Every day | ORAL | 0 refills | Status: DC

## 2019-09-29 ENCOUNTER — Other Ambulatory Visit (HOSPITAL_COMMUNITY): Payer: Self-pay | Admitting: *Deleted

## 2019-09-29 ENCOUNTER — Telehealth (HOSPITAL_COMMUNITY): Payer: Self-pay | Admitting: *Deleted

## 2019-09-29 ENCOUNTER — Telehealth: Payer: Self-pay

## 2019-09-29 NOTE — Telephone Encounter (Signed)
I think it is OK although why would anyone need two tablets on antianxiety medication only? She must be out of town for a day.Can you call that in?

## 2019-09-29 NOTE — Telephone Encounter (Signed)
Writer received phone call from pt stating that she is out of town and left Klonopin at home. Pt is asking if we would [facility] [place], [place], [place]. Please review and advise.

## 2019-09-29 NOTE — Telephone Encounter (Signed)
Pt was called and told she would have to contact her Behavioral health MD as they are the ones who RX this medication. She verbalized understanding

## 2019-09-29 NOTE — Telephone Encounter (Signed)
Patient out of town and forgot her meds, she states that she really needs her clonazePAM (KLONOPIN) 1 MG tablet  For her anxiety.  Patient is requesting a couple of pills until they get home tomorrow sometime.  [facility] [place], [place] [address]  Please advise. Patient can be reached at [phone]

## 2019-09-29 NOTE — Telephone Encounter (Signed)
Writer called in rx for Klonopin 1mg  [facility] prn [facility] [address] per [address]. [address]. This nurse called pt to inform. Pt very appreciative.

## 2019-10-26 ENCOUNTER — Encounter: Payer: Self-pay | Admitting: Family Medicine

## 2019-10-26 ENCOUNTER — Telehealth: Payer: Self-pay | Admitting: Family Medicine

## 2019-10-26 ENCOUNTER — Other Ambulatory Visit: Payer: Self-pay

## 2019-10-26 ENCOUNTER — Ambulatory Visit (INDEPENDENT_AMBULATORY_CARE_PROVIDER_SITE_OTHER): Payer: Medicare Other | Admitting: Family Medicine

## 2019-10-26 VITALS — BP 104/53 | HR 64 | Temp 97.5°F | Resp 18 | Ht 65.0 in | Wt 154.4 lb

## 2019-10-26 DIAGNOSIS — R7989 Other specified abnormal findings of blood chemistry: Secondary | ICD-10-CM

## 2019-10-26 DIAGNOSIS — E039 Hypothyroidism, unspecified: Secondary | ICD-10-CM | POA: Diagnosis not present

## 2019-10-26 DIAGNOSIS — R251 Tremor, unspecified: Secondary | ICD-10-CM | POA: Diagnosis not present

## 2019-10-26 DIAGNOSIS — E559 Vitamin D deficiency, unspecified: Secondary | ICD-10-CM

## 2019-10-26 LAB — T4, FREE: Free T4: 0.63 ng/dL (ref 0.60–1.60)

## 2019-10-26 LAB — TSH: TSH: 8.88 u[IU]/mL — ABNORMAL HIGH (ref 0.35–4.50)

## 2019-10-26 LAB — VITAMIN D 25 HYDROXY (VIT D DEFICIENCY, FRACTURES): VITD: 15 ng/mL — ABNORMAL LOW (ref 30.00–100.00)

## 2019-10-26 MED ORDER — LEVOTHYROXINE SODIUM 112 MCG PO TABS: 112.0000 ug | ORAL_TABLET | Freq: Every day | ORAL | 0 refills | Status: DC

## 2019-10-26 MED ORDER — VITAMIN D 50 MCG (2000 UT) PO TABS: 2000.0000 [IU] | ORAL_TABLET | Freq: Every day | ORAL | 3 refills | Status: DC

## 2019-10-26 NOTE — Patient Instructions (Signed)
Great to see you today.  I will call you with lab results and guide you on doses.   Happy easter.

## 2019-10-26 NOTE — Telephone Encounter (Signed)
Pt was called and given information and scheduled for lab appt

## 2019-10-26 NOTE — Progress Notes (Signed)
This visit occurred during the SARS-CoV-2 public health emergency.  Safety protocols were in place, including screening questions prior to the visit, additional usage of staff PPE, and extensive cleaning of exam room while observing appropriate contact time as indicated for disinfecting solutions.    Katie Rojas , 1960-10-01, 59 y.o., female MRN: [id number] Patient Care Team    Relationship Specialty Notifications Start End  Natalia Leatherwood, DO PCP - General Family Medicine  10/21/18   Magdalene Patricia, MD Consulting Physician Psychiatry  10/21/18   Gastroenterology, [facility] Sprang    10/22/18   Vladimir Faster, DO Consulting Physician Neurology  09/06/19     Chief Complaint  Patient presents with  . Hypothyroidism    8 week thyroid recheck      Subjective: Katie Rojas is a 59 y.o. female present today to follow-up on her elevated thyroid panel and decreased vitamin D.  She reports she is tolerating the higher dose levothyroxine 100 mcg daily.  She reports compliance with this medication daily.  Does admit to forgetting to take the vitamin D once weekly on occasions.  She states it is hard for her to remember to take a once weekly medication.  She is not taking the primidone prescribed by her neurology team for her tremor.  She states she took for 3 days and it did not work so she stopped.  She states she has not had a drink in 3 months.  Prior note:  Pt presents for an OV with complaints of weight gain over the last few months of 10 pounds.  She does endorse eating more sweets and sugary snacks.  She states she does not routinely exercise.  She has a recent orthopedic injury of her lower extremity preventing her from exercising.  She endorses increased fatigue.  She also states her hair and nails feels like they are getting dry and thin.  By EMR review she has gained 18 pounds over the past year.  Patient endorses constipation.  She states she sometimes goes without a bowel  movement for a week.  She is prescribed Zoloft, Klonopin, Wellbutrin and Abilify through her psychiatry team.  Abilify was recently added over the last few months.  She also has hypothyroid condition in which she reports compliance with levothyroxine 88 mcg daily on an empty stomach.  Her last thyroid levels were completed last summer and were normal.  Depression screen [facility] 2/9 02/02/2019 10/21/2018  Decreased Interest 2 2  Down, Depressed, Hopeless 3 2  PHQ - 2 Score 5 4  Altered sleeping 1 2  Tired, decreased energy 2 1  Change in appetite 2 2  Feeling bad or failure about yourself  3 2  Trouble concentrating 2 2  Moving slowly or fidgety/restless 0 0  Suicidal thoughts 1 0  PHQ-9 Score 16 13  Difficult doing work/chores Very difficult Somewhat difficult    Allergies  Allergen Reactions  . 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 Chicken pox   . Depression with anxiety   . Fracture of 5th metatarsal 2017   rigth ankle  sprain and 5th meatarsal fx- managed with boot.   . Hepatitis B 1979  . History of colon polyps   . Hyperthyroidism 2005   Nuclear rad tx (I131)-- on synthoid  . Obsessive-compulsive disorder   . Ophthalmic Graves disease 2005  . Right shoulder pain 11/12/2017   Normal x-ray.  Mild spurring of AC joint of shoulder.   Past Surgical History:  Procedure Laterality Date  . ABDOMINAL HYSTERECTOMY    . BLEPHAROPLASTY Bilateral 2005   Secondary from proptosis from ocular Graves' disease.  . ORBITAL OSTEOTOMY Bilateral    Ocular Graves' disease  . WISDOM TOOTH EXTRACTION     Family History  Problem Relation Age of Onset  .  Schizophrenia Paternal Grandmother   . Arthritis Paternal Grandmother   . Drug abuse Brother   . Alcohol abuse Brother   . Learning disabilities Sister   . COPD Mother   . Hypertension Mother   . Miscarriages / [place] Mother   . Lung cancer Mother   . Tremor Mother        bilateral hands  . Early death Father   . Hypertension Father   . Pancreatitis Father   . Depression Daughter   . Alcohol abuse Maternal Grandmother   . Hearing loss Maternal Grandmother   . Alcoholism Maternal Grandfather   . Hearing loss Paternal Grandfather   . Stroke Paternal Grandfather   . Depression Sister   . Tremor Sister        left hand- oldest of her sisters  . Alcohol abuse Sister   . Tremor Nephew        hand; youngest sisters son   Allergies as of 10/26/2019      Reactions   Relafen [nabumetone] Other (See Comments)   Stomach upset      Medication List       Accurate as of October 26, 2019 12:54 PM. If you have any questions, ask your nurse or doctor.        buPROPion 300 MG 24 hr tablet Commonly known as: WELLBUTRIN XL Take 1 tablet (300 mg total) by mouth daily.   clonazePAM 1 MG tablet Commonly known as: KLONOPIN Take 1 tablet (1 mg total) by mouth 2 (two) times daily as needed for anxiety.   levothyroxine 100 MCG tablet Commonly known as: SYNTHROID Take 1 tablet (100 mcg total) by mouth daily before breakfast. GENERIC OKAY   primidone 50 MG tablet Commonly known as: MYSOLINE Take 1 tablet (50 mg total) by mouth at bedtime.   sertraline 100 MG tablet Commonly known as: ZOLOFT Take 2 tablets (200 mg total) by mouth daily.   Vitamin D (Ergocalciferol) 1.25 MG (50000 UNIT) Caps capsule Commonly known as: DRISDOL Take 1 capsule (50,000 Units total) by mouth every 7 (seven) days.       All past medical history, surgical history, allergies, family history, immunizations andmedications were updated in the EMR today and reviewed under the history and medication portions  of their EMR.     ROS: Negative, with the exception of above mentioned in HPI   Objective:  BP (!) 104/53 (BP Location: Left Arm, Patient Position: Sitting, Cuff Size: Normal)   Pulse 64   Temp (!) 97.5 F (36.4 C) (Temporal)   Resp 18   Ht 5\' 5"  (1.651 m)   Wt 154 lb 6 oz (70 kg)   SpO2 96%   BMI 25.69 kg/m  Body mass index is 25.69 kg/m. Gen: Afebrile. No acute distress.  HENT: AT. [place].  Eyes:Pupils Equal Round Reactive to light, Extraocular movements intact,  Conjunctiva without redness, discharge or icterus. Neck/lymp/endocrine: Supple, no lymphadenopathy, no thyromegaly CV: RRR no murmur, no edema Chest: CTAB, no wheeze or crackles Neuro:  Normal gait. PERLA. EOMi. Alert. Oriented x3.  Mild essential tremor still present.  Marland Kitchen Psych: Normal affect, dress and demeanor. Normal speech. Normal thought content and judgment.   No exam data present No results found. No results found for this or any previous visit (from the past 24 hour(s)).  Assessment/Plan: Laurna Shetley is a 59 y.o. female present for OV for  hypothyroidism Recheck thyroid levels today after recent increase.  Patient reports compliance.  Medication will be refilled after laboratory results to ensure appropriate dosing. -Encouraged her to speak to her psychiatry team about her medications. Advised her to not stop her medications without talking to her psychiatrist first.  Advised her changing her diet would be a first step since she has identified eating higher calorie/sugary snacks, than changing a medication that is working well for her.  She reports understanding. -Exercise is a barrier at this time secondary to orthopedic injury.  She could focus on upper body exercises. - Consider sugar-free products. - TSH - T4, free  Vit d def:  Recheck levels today.  She has missed doses.  Daily dose would likely be easier for her to remember. Daily dosing will be prescribed after laboratory results  received.  Essential tremor: Encouraged her if she is interested in treating her tremors she should give the primidone more than 3 days of trial.  Also encouraged her to follow-up with the neurology team-primidone may need tapered for full effectiveness.  Follow-up dependent upon lab results   Reviewed expectations re: course of current medical issues.  Discussed self-management of symptoms.  Outlined signs and symptoms indicating need for more acute intervention.  Patient verbalized understanding and all questions were answered.  Patient received an After-Visit Summary.    Orders Placed This Encounter  Procedures  . Vitamin D (25 hydroxy)  . TSH  . T4, free  No orders of the defined types were placed in this encounter.  Referral Orders  No referral(s) requested today    Note is dictated utilizing voice recognition software. Although note has been proof read prior to signing, occasional typographical errors still can be missed. If any questions arise, please do not hesitate to call for verification.   electronically signed by:  Howard Pouch, DO  [place]

## 2019-10-26 NOTE — Telephone Encounter (Signed)
Please inform patient her thyroid studies are still mildly under replaced.  I have called in a new dose for her to start daily on empty stomach.  New doses levothyroxine 112 mcg daily. Her vitamin D is still significantly low at 15.  She reported having difficulty remembering taking her once weekly dose.  Therefore I have called in 2000 units of vitamin D to be taken daily with food.  If desired she can finish the 50,000 units once weekly also.  She will need to follow-up with a lab appointment only in 8-10 weeks.  Lab orders placed

## 2019-11-14 ENCOUNTER — Other Ambulatory Visit: Payer: Self-pay | Admitting: Family Medicine

## 2019-11-22 ENCOUNTER — Other Ambulatory Visit: Payer: Self-pay | Admitting: Family Medicine

## 2019-11-22 NOTE — Telephone Encounter (Signed)
In review of the phone note- it appears patient was to stop the ergocalciferol and start the daily vitamin D supplementation prescribed of 2000 units daily.

## 2019-11-22 NOTE — Telephone Encounter (Signed)
Do you want her to continue Ergocalciferol?  

## 2019-11-29 ENCOUNTER — Other Ambulatory Visit: Payer: Self-pay | Admitting: Family Medicine

## 2019-12-23 ENCOUNTER — Ambulatory Visit: Payer: Medicare Other

## 2020-01-04 ENCOUNTER — Telehealth: Payer: Self-pay | Admitting: Family Medicine

## 2020-01-04 ENCOUNTER — Encounter: Payer: Self-pay | Admitting: Family Medicine

## 2020-01-04 ENCOUNTER — Ambulatory Visit (INDEPENDENT_AMBULATORY_CARE_PROVIDER_SITE_OTHER): Payer: Medicare Other | Admitting: Family Medicine

## 2020-01-04 ENCOUNTER — Other Ambulatory Visit: Payer: Self-pay

## 2020-01-04 VITALS — BP 101/69 | HR 87 | Temp 98.0°F | Resp 17 | Ht 65.0 in | Wt 153.5 lb

## 2020-01-04 DIAGNOSIS — E039 Hypothyroidism, unspecified: Secondary | ICD-10-CM | POA: Diagnosis not present

## 2020-01-04 DIAGNOSIS — S39011A Strain of muscle, fascia and tendon of abdomen, initial encounter: Secondary | ICD-10-CM | POA: Diagnosis not present

## 2020-01-04 DIAGNOSIS — R7989 Other specified abnormal findings of blood chemistry: Secondary | ICD-10-CM

## 2020-01-04 DIAGNOSIS — E559 Vitamin D deficiency, unspecified: Secondary | ICD-10-CM | POA: Diagnosis not present

## 2020-01-04 DIAGNOSIS — L989 Disorder of the skin and subcutaneous tissue, unspecified: Secondary | ICD-10-CM

## 2020-01-04 LAB — TSH: TSH: 0.75 u[IU]/mL (ref 0.35–4.50)

## 2020-01-04 LAB — VITAMIN D 25 HYDROXY (VIT D DEFICIENCY, FRACTURES): VITD: 19.83 ng/mL — ABNORMAL LOW (ref 30.00–100.00)

## 2020-01-04 MED ORDER — LEVOTHYROXINE SODIUM 112 MCG PO TABS: 112.0000 ug | ORAL_TABLET | Freq: Every day | ORAL | 3 refills | Status: DC

## 2020-01-04 MED ORDER — VITAMIN D3 125 MCG (5000 UT) PO TABS: 1.0000 | ORAL_TABLET | Freq: Every day | ORAL | 3 refills | Status: DC

## 2020-01-04 MED ORDER — TRIAMCINOLONE ACETONIDE 0.1 % EX CREA
1.0000 | TOPICAL_CREAM | Freq: Two times a day (BID) | CUTANEOUS | 0 refills | Status: DC
Start: 2020-01-04 — End: 2020-01-26

## 2020-01-04 NOTE — Telephone Encounter (Signed)
Please inform patient the following information: Her vit d is still very low. Although a little higher at 15. Goal 40-50. I have increased her daily dose to 5000 units daily. I have called in the new script.  Her thyroid is in normal range. I have refilled her thyroid medication at current dose.   Follow up in 8 weeks with provider. Please schedule. We will need to recheck vit d levels and rule out other causes for her lack of absorption of vit d.

## 2020-01-04 NOTE — Progress Notes (Signed)
This visit occurred during the SARS-CoV-2 public health emergency.  Safety protocols were in place, including screening questions prior to the visit, additional usage of staff PPE, and extensive cleaning of exam room while observing appropriate contact time as indicated for disinfecting solutions.    Katie Rojas , [birth year], 59 y.o., female MRN: [id number] Patient Care Team    Relationship Specialty Notifications Start End  Natalia Leatherwood, DO PCP - General Family Medicine  10/21/18   Magdalene Patricia, MD Consulting Physician Psychiatry  10/21/18   Gastroenterology, Deboraha Sprang    10/22/18   Vladimir Faster, DO Consulting Physician Neurology  09/06/19     Chief Complaint  Patient presents with  . Nevus    Pt has mole that is at panyline that has been there for years and recently has gotten irritated. She had bug bite and was scratching area and thinks she irritated mole      Subjective: Pt presents for an OV with complaints of mole that has become irritated.  Patient reports she had a bug bite near her mole on her left hip.  With scratching her bug bite she has noticed the mole has become irritated over the last week.  She denies any draining or bleeding.  She states that it is more red than typical.  Mole itself has been present for quite a few years.  In addition she reports she has had left lower abdominal pain intermittently over the last few weeks.  She denies fevers, chills, nausea, vomit or bowel changes.  She denies any skin changes.   Depression screen [facility] 2/9 02/02/2019 10/21/2018  Decreased Interest 2 2  Down, Depressed, Hopeless 3 2  PHQ - 2 Score 5 4  Altered sleeping 1 2  Tired, decreased energy 2 1  Change in appetite 2 2  Feeling bad or failure about yourself  3 2  Trouble concentrating 2 2  Moving slowly or fidgety/restless 0 0  Suicidal thoughts 1 0  PHQ-9 Score 16 13  Difficult doing work/chores Very difficult Somewhat difficult    Allergies  Allergen  Reactions  . 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 Chicken pox   . Depression with anxiety   . Fracture of 5th metatarsal 2017   rigth ankle sprain and 5th meatarsal fx- managed with boot.   . Hepatitis B 1979  . History of colon polyps   . Hyperthyroidism 2005   Nuclear rad tx (I131)-- on synthoid  . Obsessive-compulsive disorder   . Ophthalmic Graves disease 2005  . Right shoulder pain 11/12/2017   Normal x-ray.  Mild spurring of AC joint of shoulder.   Past Surgical History:  Procedure Laterality Date  . ABDOMINAL HYSTERECTOMY    . BLEPHAROPLASTY Bilateral 2005   Secondary from proptosis from ocular Graves' disease.  . ORBITAL OSTEOTOMY Bilateral    Ocular Graves' disease  . WISDOM TOOTH EXTRACTION     Family History  Problem Relation Age of Onset  . Schizophrenia Paternal Grandmother   . Arthritis Paternal Grandmother   . Drug abuse Brother   . Alcohol  abuse Brother   . Learning disabilities Sister   . COPD Mother   . Hypertension Mother   . Miscarriages / [place] Mother   . Lung cancer Mother   . Tremor Mother        bilateral hands  . Early death Father   . Hypertension Father   . Pancreatitis Father   . Depression Daughter   . Alcohol abuse Maternal Grandmother   . Hearing loss Maternal Grandmother   . Alcoholism Maternal Grandfather   . Hearing loss Paternal Grandfather   . Stroke Paternal Grandfather   . Depression Sister   . Tremor Sister        left hand- oldest of her sisters  . Alcohol abuse Sister   . Tremor Nephew        hand; youngest sisters son   Allergies as of 01/04/2020      Reactions   Relafen  [nabumetone] Other (See Comments)   Stomach upset      Medication List       Accurate as of January 04, 2020 11:59 PM. If you have any questions, ask your nurse or doctor.        buPROPion 300 MG 24 hr tablet Commonly known as: WELLBUTRIN XL Take 1 tablet (300 mg total) by mouth daily.   clonazePAM 1 MG tablet Commonly known as: KLONOPIN Take 1 tablet (1 mg total) by mouth 2 (two) times daily as needed for anxiety.   levothyroxine 112 MCG tablet Commonly known as: SYNTHROID Take 1 tablet (112 mcg total) by mouth daily before breakfast. GENERIC OKAY   primidone 50 MG tablet Commonly known as: MYSOLINE Take 1 tablet (50 mg total) by mouth at bedtime.   sertraline 100 MG tablet Commonly known as: ZOLOFT Take 2 tablets (200 mg total) by mouth daily.   triamcinolone cream 0.1 % Commonly known as: KENALOG Apply 1 application topically 2 (two) times daily. Started by: Howard Pouch, DO   Vitamin D3 125 MCG (5000 UT) Tabs Take 1 tablet (5,000 Units total) by mouth daily. What changed:   medication strength  how much to take Changed by: Howard Pouch, DO       All past medical history, surgical history, allergies, family history, immunizations andmedications were updated in the EMR today and reviewed under the history and medication portions of their EMR.     ROS: Negative, with the exception of above mentioned in HPI   Objective:  BP 101/69 (BP Location: Left Arm, Patient Position: Sitting, Cuff Size: Normal)   Pulse 87   Temp 98 F (36.7 C) (Temporal)   Resp 17   Ht 5\' 5"  (1.651 m)   Wt 153 lb 8 oz (69.6 kg)   SpO2 97%   BMI 25.54 kg/m  Body mass index is 25.54 kg/m. Gen: Afebrile. No acute distress. Nontoxic in appearance, well developed, well nourished.  HENT: AT. [place].  Eyes:Pupils Equal Round Reactive to light, Extraocular movements intact,  Conjunctiva without redness, discharge or icterus.  Abd: Soft.  Flat. ND.  Mildly tender to palpation over left flank.   BS present.  No masses palpated. No rebound or guarding.  Skin: 0.5 cm x 0.5 cm round mildly raised hyperpigmented lesion left lateral hip.  Appears mildly irritated.  Mildly discolored in comparison to her other similar lesions, no purpura or petechiae.  Neuro: Normal gait. PERLA. EOMi. Alert. Oriented x3  Psych: Normal affect, dress and demeanor. Normal speech. Normal thought content and judgment.  No exam data  present No results found. No results found for this or any previous visit (from the past 24 hour(s)).  Assessment/Plan: Alyxis Grippi is a 59 y.o. female present for OV for  Skin lesion Kenalog cream to apply twice daily for the next 2 weeks. Referral to dermatology placed for further evaluation. - Ambulatory referral to Dermatology  Strain of abdominal muscle, initial encounter Her abdominal discomfort is likely musculoskeletal in nature. Encouraged her to avoid heavy lifting until symptoms improve. She can take anti-inflammatories OTC for comfort. If her pain would happen to worsen or become associated with bowel changes, dysuria fevers or chills to follow-up immediately.   Reviewed expectations re: course of current medical issues.  Discussed self-management of symptoms.  Outlined signs and symptoms indicating need for more acute intervention.  Patient verbalized understanding and all questions were answered.  Patient received an After-Visit Summary.    Orders Placed This Encounter  Procedures  . Ambulatory referral to Dermatology   Meds ordered this encounter  Medications  . triamcinolone cream (KENALOG) 0.1 %    Sig: Apply 1 application topically 2 (two) times daily.    Dispense:  30 g    Refill:  0    Referral Orders     Ambulatory referral to Dermatology   Note is dictated utilizing voice recognition software. Although note has been proof read prior to signing, occasional typographical errors still can be missed. If any questions arise, please  do not hesitate to call for verification.   electronically signed by:  Felix Pacini, DO   [facility] - [place]

## 2020-01-04 NOTE — Patient Instructions (Signed)
I have referred you to dermatology for your skin lesion.  Use the cream twice a day on your skin lesion.   Your abdominal discomfort sounds like a strain of a muscle. Avoid heavy lifting. You can take NSAids for comfort.   If worsening pain, fever, mass  or bowel changes you will need to be seen.

## 2020-01-05 NOTE — Telephone Encounter (Signed)
Pt was called and given lab results, she verbalized understanding. Pt said she has not been taking the Vit D due to forgetting to take it. She will start taking with her regular medications so she does not forget. Pt did not want to make her appt at this time due to moving and not knowing when she will be in town to be seen. She will call back after she is able to look at her calender.

## 2020-01-09 ENCOUNTER — Other Ambulatory Visit (HOSPITAL_COMMUNITY): Payer: Self-pay | Admitting: *Deleted

## 2020-01-09 MED ORDER — BUPROPION HCL ER (XL) 300 MG PO TB24: 300.0000 mg | ORAL_TABLET | Freq: Every day | ORAL | 0 refills | Status: DC

## 2020-01-16 ENCOUNTER — Telehealth: Payer: Self-pay | Admitting: Physician Assistant

## 2020-01-16 NOTE — Telephone Encounter (Signed)
Patient is calling to schedule a referral appointment from Dr. Felix Pacini with [facility].

## 2020-01-18 ENCOUNTER — Telehealth: Payer: Self-pay

## 2020-01-18 NOTE — Telephone Encounter (Signed)
Pt has rash at pelvic bone and in between legs on thighs. Please advise if you would like to work pt in or offer ealier appt with another Provider

## 2020-01-18 NOTE — Telephone Encounter (Signed)
I can see her tomorrow at 11:45

## 2020-01-18 NOTE — Telephone Encounter (Signed)
Patient called in wanting to see if she could be worked in today or tomorrow I didn't see any available spots so I scheduled her for 01/24/20 she is still wanting to see if Dr could or would work her in tomorrow     Please advise

## 2020-01-18 NOTE — Telephone Encounter (Signed)
Called pt and she agreed with appt time. Other appt cancelled

## 2020-01-19 ENCOUNTER — Other Ambulatory Visit: Payer: Self-pay

## 2020-01-19 ENCOUNTER — Encounter: Payer: Self-pay | Admitting: Family Medicine

## 2020-01-19 ENCOUNTER — Ambulatory Visit (INDEPENDENT_AMBULATORY_CARE_PROVIDER_SITE_OTHER): Payer: Medicare Other | Admitting: Family Medicine

## 2020-01-19 VITALS — BP 113/77 | Temp 97.8°F | Resp 16 | Wt 153.0 lb

## 2020-01-19 DIAGNOSIS — B86 Scabies: Secondary | ICD-10-CM | POA: Diagnosis not present

## 2020-01-19 MED ORDER — PERMETHRIN 5 % EX CREA: 1.0000 "application " | TOPICAL_CREAM | Freq: Once | CUTANEOUS | 0 refills | Status: AC

## 2020-01-19 MED ORDER — HYDROXYZINE HCL 10 MG PO TABS: 10.0000 mg | ORAL_TABLET | Freq: Three times a day (TID) | ORAL | 0 refills | Status: DC | PRN

## 2020-01-19 MED ORDER — IVERMECTIN 3 MG PO TABS: 200.0000 ug/kg | ORAL_TABLET | Freq: Once | ORAL | 0 refills | Status: DC

## 2020-01-19 NOTE — Patient Instructions (Signed)
I called in cream> if covered apply toes to hairline before bed and leave on for 10-14 hours> then shower it off.   If ivermectin  pills covered> you take all 4 pills at the same time.    Vistaril/hydroxine is the medication for itching only.   Follow up 1 week and we can also cover your mental health meds.    Scabies, Adult  Scabies is a skin condition that happens when very small insects get under the skin (infestation). This causes a rash and severe itchiness. Scabies can spread from person to person (is contagious). If you get scabies, it is common for others in your household to get scabies too. With proper treatment, symptoms usually go away in 2-4 weeks. Scabies usually does not cause lasting problems. What are the causes? This condition is caused by tiny mites (Sarcoptes scabiei, or human itch mites) that can only be seen with a microscope. The mites get into the top layer of skin and lay eggs. Scabies can spread from person to person through:  Close contact with a person who has scabies.  Sharing or having contact with infested items, such as towels, bedding, or clothing. What increases the risk? The following factors may make you more likely to develop this condition:  Living in a nursing home or other extended care facility.  Having sexual contact with a partner who has scabies.  Caring for others who are at increased risk for scabies. What are the signs or symptoms? Symptoms of this condition include:  Severe itchiness. This is often worse at night.  A rash that includes tiny red bumps or blisters. The rash commonly occurs on the hands, wrists, elbows, armpits, chest, waist, groin, or buttocks. The bumps may form a line (burrow) in some areas.  Skin irritation. This can include scaly patches or sores. How is this diagnosed? This condition may be diagnosed based on:  A physical exam of the skin.  A skin test. Your health care provider may take a sample of your  affected skin (skin scraping) and have it examined under a microscope for signs of mites. How is this treated? This condition may be treated with:  Medicated cream or lotion that kills the mites. This is spread on the entire body and left on for several hours. Usually, one treatment with medicated cream or lotion is enough to kill all the mites. In severe cases, the treatment may need to be repeated.  Medicated cream that relieves itching.  Medicines taken by mouth (orally) that: ? Relieve itching. ? Reduce the swelling and redness. ? Kill the mites. This treatment may be done in severe cases. Follow these instructions at home: Medicines   Take or apply over-the-counter and prescription medicines as told by your health care provider.  Apply medicated cream or lotion as told by your health care provider.  Do not wash off the medicated cream or lotion until the necessary amount of time has passed. Skin care   Avoid scratching the affected areas of your skin.  Keep your fingernails closely trimmed to reduce injury from scratching.  Take cool baths or apply cool washcloths to your skin to help reduce itching. General instructions  Clean all items that you recently had contact with, including bedding, clothing, and furniture. Do this on the same day that you start treatment. ? Dry clean items, or use hot water to wash items. Dry items on the hot dry cycle. ? Place items that cannot be washed into closed, airtight  plastic bags for at least 3 days. The mites cannot live for more than 3 days away from human skin. ? Vacuum furniture and mattresses that you use.  Make sure that other people who may have been infested are examined by a health care provider. These include members of your household and anyone who may have had contact with infested items.  Keep all follow-up visits as told by your health care provider. This is important. Contact a health care provider if:  You have itching  that does not go away after 4 weeks of treatment.  You continue to develop new bumps or burrows.  You have redness, swelling, or pain in your rash area after treatment.  You have fluid, blood, or pus coming from your rash. Summary  Scabies is a skin condition that causes a rash and severe itchiness.  This condition is caused by tiny mites that get into the top layer of the skin and lay eggs.  Scabies can spread from person to person.  Follow treatments as recommended by your health care provider.  Clean all items that you recently had contact with. This information is not intended to replace advice given to you by your health care provider. Make sure you discuss any questions you have with your health care provider. Document Revised: 05/26/2018 Document Reviewed: 05/26/2018 Elsevier Patient Education  2020 [employer].

## 2020-01-19 NOTE — Progress Notes (Signed)
This visit occurred during the SARS-CoV-2 public health emergency.  Safety protocols were in place, including screening questions prior to the visit, additional usage of staff PPE, and extensive cleaning of exam room while observing appropriate contact time as indicated for disinfecting solutions.    Katie Rojas , Mar 08, 1961, 59 y.o., female MRN: [id number] Patient Care Team    Relationship Specialty Notifications Start End  Natalia Leatherwood, DO PCP - General Family Medicine  10/21/18   Magdalene Patricia, MD Consulting Physician Psychiatry  10/21/18   Gastroenterology, Deboraha Sprang    10/22/18   Vladimir Faster, DO Consulting Physician Neurology  09/06/19     Chief Complaint  Patient presents with  . Rash    4-5 days, groin area     Subjective: Pt presents for an OV with complaints of rash of 5 days duration.  Associated symptoms include intense itching.  Rash is located along her underwear line.  Patient reports intense itching that has caused bruising across her abdomen.  Steroid cream without relief.  Patient denies any changes in personal hygiene products, laundry detergents/softeners, new clothing etc.  She does endorse moving into a new house which has old carpet.  She states she was sitting on the carpet when unpacking the house.  Depression screen [facility] 2/9 02/02/2019 10/21/2018  Decreased Interest 2 2  Down, Depressed, Hopeless 3 2  PHQ - 2 Score 5 4  Altered sleeping 1 2  Tired, decreased energy 2 1  Change in appetite 2 2  Feeling bad or failure about yourself  3 2  Trouble concentrating 2 2  Moving slowly or fidgety/restless 0 0  Suicidal thoughts 1 0  PHQ-9 Score 16 13  Difficult doing work/chores Very difficult Somewhat difficult    Allergies  Allergen Reactions  . 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 Chicken pox   . Depression with anxiety   . Fracture of 5th metatarsal 2017   rigth ankle sprain and 5th meatarsal fx- managed with boot.   . Hepatitis B 1979  . History of colon polyps   . Hyperthyroidism 2005   Nuclear rad tx (I131)-- on synthoid  . Obsessive-compulsive disorder   . Ophthalmic Graves disease 2005  . Right shoulder pain 11/12/2017   Normal x-ray.  Mild spurring of AC joint of shoulder.   Past Surgical History:  Procedure Laterality Date  . ABDOMINAL HYSTERECTOMY    . BLEPHAROPLASTY Bilateral 2005   Secondary from proptosis from ocular Graves' disease.  . ORBITAL OSTEOTOMY Bilateral    Ocular Graves' disease  . WISDOM TOOTH EXTRACTION     Family History  Problem Relation Age of Onset  . Schizophrenia Paternal Grandmother   . Arthritis Paternal Grandmother   . Drug abuse Brother   . Alcohol abuse Brother   . Learning disabilities Sister   . COPD Mother   . Hypertension Mother   . Miscarriages / [place] Mother   . Lung cancer Mother   . Tremor Mother        bilateral hands  . Early death Father   .  Hypertension Father   . Pancreatitis Father   . Depression Daughter   . Alcohol abuse Maternal Grandmother   . Hearing loss Maternal Grandmother   . Alcoholism Maternal Grandfather   . Hearing loss Paternal Grandfather   . Stroke Paternal Grandfather   . Depression Sister   . Tremor Sister        left hand- oldest of her sisters  . Alcohol abuse Sister   . Tremor Nephew        hand; youngest sisters son   Allergies as of 01/19/2020      Reactions   Relafen [nabumetone] Other (See Comments)   Stomach upset      Medication List       Accurate as of January 19, 2020  1:47 PM. If you have any questions, ask your nurse or doctor.        buPROPion 300 MG 24 hr  tablet Commonly known as: WELLBUTRIN XL Take 1 tablet (300 mg total) by mouth daily.   clonazePAM 1 MG tablet Commonly known as: KLONOPIN Take 1 tablet (1 mg total) by mouth 2 (two) times daily as needed for anxiety.   hydrOXYzine 10 MG tablet Commonly known as: ATARAX/VISTARIL Take 1 tablet (10 mg total) by mouth 3 (three) times daily as needed. Started by: Howard Pouch, DO   ivermectin 3 MG Tabs tablet Commonly known as: STROMECTOL Take 4.5 tablets (13,500 mcg total) by mouth once for 1 dose. Started by: Howard Pouch, DO   levothyroxine 112 MCG tablet Commonly known as: SYNTHROID Take 1 tablet (112 mcg total) by mouth daily before breakfast. GENERIC OKAY   permethrin 5 % cream Commonly known as: ELIMITE Apply 1 application topically once for 1 dose. Started by: Howard Pouch, DO   primidone 50 MG tablet Commonly known as: MYSOLINE Take 1 tablet (50 mg total) by mouth at bedtime.   sertraline 100 MG tablet Commonly known as: ZOLOFT Take 2 tablets (200 mg total) by mouth daily.   triamcinolone cream 0.1 % Commonly known as: KENALOG Apply 1 application topically 2 (two) times daily.   Vitamin D3 125 MCG (5000 UT) Tabs Take 1 tablet (5,000 Units total) by mouth daily.       All past medical history, surgical history, allergies, family history, immunizations andmedications were updated in the EMR today and reviewed under the history and medication portions of their EMR.     ROS: Negative, with the exception of above mentioned in HPI   Objective:  BP 113/77 (BP Location: Right Arm, Patient Position: Sitting, Cuff Size: Normal)   Temp 97.8 F (36.6 C) (Temporal)   Resp 16   Wt 153 lb (69.4 kg)   SpO2 99%   BMI 25.46 kg/m  Body mass index is 25.46 kg/m. Gen: Afebrile. No acute distress. Nontoxic in appearance, well developed, well nourished.  HENT: AT. [place].  Eyes:Pupils Equal Round Reactive to light, Extraocular movements intact,  Conjunctiva without redness,  discharge or icterus. Skin: Single small red bumps along panty line, lower abdomen/pubic area, posterior bilateral knees,no  purpura or petechiae.  Bruising across lower pelvic region Neuro:  Normal gait. PERLA. EOMi. Alert. Oriented x3  Psych: Normal affect, dress and demeanor. Normal speech. Normal thought content and judgment.  No exam data present No results found. No results found for this or any previous visit (from the past 24 hour(s)).  Assessment/Plan: Ruhee Enck is a 59 y.o. female present for OV for  Scabies Patient's rash appears insect related.  The presentation and intensity of the itching is consistent with scabies.  Rash is along the elastic sections of underwear along hips, groin, abdomen.  She also has same rash behind bilateral knees.  Possible exposure in her new home-which helps old carpet she was sitting on prior to onset of symptoms. Discussed treatment with her today.  In attempting to prescribe medication for her is difficult to see if permethrin cream or ivermectin pills are better covered or cheaper for her. Called both permethrin cream and ivermectin pills in for her.  She was given instructions on only needing 1 method, whichever is cheaper for her is fine.  She was instructed on proper use of both medications. AVS information was provided to her on scabies and how to treat the home to prevent further infection. Vistaril 10 mg 3 times daily as needed-caution on sedation provided.  She understands even after treatment the itching can last for a couple weeks. Follow-up in 1-2 weeks for recheck and discuss management of her mental health conditions which she would like [place] to take over for her.   Reviewed expectations re: course of current medical issues.  Discussed self-management of symptoms.  Outlined signs and symptoms indicating need for more acute intervention.  Patient verbalized understanding and all questions were answered.  Patient received an  After-Visit Summary.    No orders of the defined types were placed in this encounter.  Meds ordered this encounter  Medications  . hydrOXYzine (ATARAX/VISTARIL) 10 MG tablet    Sig: Take 1 tablet (10 mg total) by mouth 3 (three) times daily as needed.    Dispense:  30 tablet    Refill:  0  . permethrin (ELIMITE) 5 % cream    Sig: Apply 1 application topically once for 1 dose.    Dispense:  60 g    Refill:  0  . ivermectin (STROMECTOL) 3 MG TABS tablet    Sig: Take 4.5 tablets (13,500 mcg total) by mouth once for 1 dose.    Dispense:  4 tablet    Refill:  0   Referral Orders  No referral(s) requested today     Note is dictated utilizing voice recognition software. Although note has been proof read prior to signing, occasional typographical errors still can be missed. If any questions arise, please do not hesitate to call for verification.   electronically signed by:  Felix Pacini, DO  [facility] [place]

## 2020-01-24 ENCOUNTER — Ambulatory Visit: Payer: Medicare Other | Admitting: Family Medicine

## 2020-01-26 ENCOUNTER — Ambulatory Visit (INDEPENDENT_AMBULATORY_CARE_PROVIDER_SITE_OTHER): Payer: Medicare Other | Admitting: Family Medicine

## 2020-01-26 ENCOUNTER — Encounter: Payer: Self-pay | Admitting: Family Medicine

## 2020-01-26 ENCOUNTER — Other Ambulatory Visit: Payer: Self-pay

## 2020-01-26 VITALS — BP 120/82 | HR 71 | Temp 97.7°F | Resp 16 | Ht 65.0 in

## 2020-01-26 DIAGNOSIS — B86 Scabies: Secondary | ICD-10-CM

## 2020-01-26 MED ORDER — IVERMECTIN 3 MG PO TABS: 200.0000 ug/kg | ORAL_TABLET | Freq: Once | ORAL | 0 refills | Status: AC

## 2020-01-26 NOTE — Patient Instructions (Signed)
10-14 days after last ivermectin dose, take new dose. This has been called in .    Good luck to you on your move.

## 2020-01-26 NOTE — Progress Notes (Signed)
This visit occurred during the SARS-CoV-2 public health emergency.  Safety protocols were in place, including screening questions prior to the visit, additional usage of staff PPE, and extensive cleaning of exam room while observing appropriate contact time as indicated for disinfecting solutions.    Katie Rojas , 04/05/1961, 59 y.o., female MRN: [id number] Patient Care Team    Relationship Specialty Notifications Start End  Natalia Leatherwood, DO PCP - General Family Medicine  10/21/18   Magdalene Patricia, MD Consulting Physician Psychiatry  10/21/18   Gastroenterology, Deboraha Sprang    10/22/18   Vladimir Faster, DO Consulting Physician Neurology  09/06/19     Chief Complaint  Patient presents with  . Follow-up    1 wk F/U for scabies.      Subjective: Katie Rojas is a 59 y.o. female presents today for follow-up on rash.  Patient presented last week with rash consistent with scabies.  Ivermectin was prescribed.  Patient reports rash is much improved and there is no longer the intense itching she experienced. Prior note: Pt presents for an OV with complaints of rash of 5 days duration.  Associated symptoms include intense itching.  Rash is located along her underwear line.  Patient reports intense itching that has caused bruising across her abdomen.  Steroid cream without relief.  Patient denies any changes in personal hygiene products, laundry detergents/softeners, new clothing etc.  She does endorse moving into a new house which has old carpet.  She states she was sitting on the carpet when unpacking the house.  Depression screen [facility] 2/9 02/02/2019 10/21/2018  Decreased Interest 2 2  Down, Depressed, Hopeless 3 2  PHQ - 2 Score 5 4  Altered sleeping 1 2  Tired, decreased energy 2 1  Change in appetite 2 2  Feeling bad or failure about yourself  3 2  Trouble concentrating 2 2  Moving slowly or fidgety/restless 0 0  Suicidal thoughts 1 0  PHQ-9 Score 16 13  Difficult doing  work/chores Very difficult Somewhat difficult    Allergies  Allergen Reactions  . 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 Chicken pox   . Depression with anxiety   . Fracture of 5th metatarsal 2017   rigth ankle sprain and 5th meatarsal fx- managed with boot.   . Hepatitis B 1979  . History of colon polyps   . Hyperthyroidism 2005   Nuclear rad tx (I131)-- on synthoid  . Obsessive-compulsive disorder   . Ophthalmic Graves disease 2005  . Right shoulder pain 11/12/2017   Normal x-ray.  Mild spurring of AC joint of shoulder.   Past Surgical History:  Procedure Laterality Date  . ABDOMINAL HYSTERECTOMY    . BLEPHAROPLASTY Bilateral 2005   Secondary from proptosis from ocular Graves' disease.  . ORBITAL OSTEOTOMY Bilateral    Ocular Graves' disease  . WISDOM TOOTH EXTRACTION     Family History  Problem Relation Age of Onset  . Schizophrenia Paternal Grandmother   . Arthritis Paternal Grandmother   . Drug abuse Brother   . Alcohol abuse Brother   .  Learning disabilities Sister   . COPD Mother   . Hypertension Mother   . Miscarriages / [place] Mother   . Lung cancer Mother   . Tremor Mother        bilateral hands  . Early death Father   . Hypertension Father   . Pancreatitis Father   . Depression Daughter   . Alcohol abuse Maternal Grandmother   . Hearing loss Maternal Grandmother   . Alcoholism Maternal Grandfather   . Hearing loss Paternal Grandfather   . Stroke Paternal Grandfather   . Depression Sister   . Tremor Sister        left hand- oldest of her sisters  . Alcohol abuse Sister   . Tremor Nephew        hand; youngest  sisters son   Allergies as of 01/26/2020      Reactions   Relafen [nabumetone] Other (See Comments)   Stomach upset      Medication List       Accurate as of January 26, 2020  1:19 PM. If you have any questions, ask your nurse or doctor.        STOP taking these medications   triamcinolone cream 0.1 % Commonly known as: KENALOG Stopped by: Felix Pacini, DO     TAKE these medications   buPROPion 300 MG 24 hr tablet Commonly known as: WELLBUTRIN XL Take 1 tablet (300 mg total) by mouth daily.   clonazePAM 1 MG tablet Commonly known as: KLONOPIN Take 1 tablet (1 mg total) by mouth 2 (two) times daily as needed for anxiety.   hydrOXYzine 10 MG tablet Commonly known as: ATARAX/VISTARIL Take 1 tablet (10 mg total) by mouth 3 (three) times daily as needed.   ivermectin 3 MG Tabs tablet Commonly known as: STROMECTOL Take 4.5 tablets (13,500 mcg total) by mouth once for 1 dose.   levothyroxine 112 MCG tablet Commonly known as: SYNTHROID Take 1 tablet (112 mcg total) by mouth daily before breakfast. GENERIC OKAY   primidone 50 MG tablet Commonly known as: MYSOLINE Take 1 tablet (50 mg total) by mouth at bedtime.   sertraline 100 MG tablet Commonly known as: ZOLOFT Take 2 tablets (200 mg total) by mouth daily.   Vitamin D3 125 MCG (5000 UT) Tabs Take 1 tablet (5,000 Units total) by mouth daily.       All past medical history, surgical history, allergies, family history, immunizations andmedications were updated in the EMR today and reviewed under the history and medication portions of their EMR.     ROS: Negative, with the exception of above mentioned in HPI   Objective:  BP 120/82 (BP Location: Right Arm, Patient Position: Sitting, Cuff Size: Normal)   Pulse 71   Temp 97.7 F (36.5 C) (Temporal)   Resp 16   Ht 5\' 5"  (1.651 m)   SpO2 98%   BMI 25.46 kg/m  Body mass index is 25.46 kg/m. Gen: Afebrile. No acute distress. Nontoxic in appearance, well developed,  well nourished.  HENT: AT. [place].  Eyes:Pupils Equal Round Reactive to light, Extraocular movements intact,  Conjunctiva without redness, discharge or icterus. Skin: Single small red bumps along panty line, lower abdomen/pubic area, posterior bilateral knees,no  purpura or petechiae.  Bruising across lower pelvic region Neuro:  Normal gait. PERLA. EOMi. Alert. Oriented x3  Psych: Normal affect, dress and demeanor. Normal speech. Normal thought content and judgment.  No exam data present No results found. No results found for this or any  previous visit (from the past 24 hour(s)).  Assessment/Plan: Katie Rojas is a 59 y.o. female present for OV for  Scabies Patient treated with ivermectin which was more affordable to her.  Rash is greatly improved today. Discussed repeat dose in 10 to 14 days after initial dose.  Second dose was prescribed for her today. Follow-up as needed    Reviewed expectations re: course of current medical issues.  Discussed self-management of symptoms.  Outlined signs and symptoms indicating need for more acute intervention.  Patient verbalized understanding and all questions were answered.  Patient received an After-Visit Summary.    No orders of the defined types were placed in this encounter.  Meds ordered this encounter  Medications  . ivermectin (STROMECTOL) 3 MG TABS tablet    Sig: Take 4.5 tablets (13,500 mcg total) by mouth once for 1 dose.    Dispense:  5 tablet    Refill:  0   Referral Orders  No referral(s) requested today     Note is dictated utilizing voice recognition software. Although note has been proof read prior to signing, occasional typographical errors still can be missed. If any questions arise, please do not hesitate to call for verification.   electronically signed by:  Howard Pouch, DO  [place]

## 2020-01-31 NOTE — Progress Notes (Signed)
Virtual Visit Via Video   The purpose of this virtual visit is to provide medical care while limiting exposure to the novel coronavirus.    Consent was obtained for video visit:  Yes.   Answered questions that patient had about telehealth interaction:  Yes.   I discussed the limitations, risks, security and privacy concerns of performing an evaluation and management service by telemedicine. I also discussed with the patient that there may be a patient responsible charge related to this service. The patient expressed understanding and agreed to proceed.  Pt location: Home Physician Location: office Name of referring provider:  Natalia Leatherwood, DO I connected with Katie Rojas at patients initiation/request on 02/03/2020 at 10:45 AM EDT by video enabled telemedicine application and verified that I am speaking with the correct person using two identifiers. Pt MRN:  [id number] Pt DOB:  1961-06-13 Video Participants:  Katie Rojas;    Assessment/Plan:   1.  Tremor  -Likely essential.  Continue primidone, 50 mg daily.    2.  Pt doing well.  Will f/u prn.  D/c back to PCP for now  Subjective   Patient seen today in follow-up for tremor.  Last visit, we talked about the complex effect that alcohol can have on tremor and recommended that that be slowly weaned.  We also talked about Abilify and its effective in tremor, but she had already been off of it for about a week when I last saw her.  We started her on primidone, 50 mg daily.  Primary care records from March indicate that the patient took it for 3 days, it did not work and she stopped it.  Primary care did indicate that she would need more than 3-day trial and recommended that she retrial the medication. She did that and is doing well on that on medication.  Med is helping.  Tolerating well.    Current movement d/o meds: Primidone 50 mg daily   Current Outpatient Medications on File Prior to Visit  Medication Sig Dispense  Refill  . buPROPion (WELLBUTRIN XL) 300 MG 24 hr tablet Take 1 tablet (300 mg total) by mouth daily. 90 tablet 0  . Cholecalciferol (VITAMIN D3) 125 MCG (5000 UT) TABS Take 1 tablet (5,000 Units total) by mouth daily. 90 tablet 3  . clonazePAM (KLONOPIN) 1 MG tablet Take 1 tablet (1 mg total) by mouth 2 (two) times daily as needed for anxiety. 180 tablet 0  . hydrOXYzine (ATARAX/VISTARIL) 10 MG tablet Take 1 tablet (10 mg total) by mouth 3 (three) times daily as needed. 30 tablet 0  . levothyroxine (SYNTHROID) 112 MCG tablet Take 1 tablet (112 mcg total) by mouth daily before breakfast. GENERIC OKAY 90 tablet 3  . primidone (MYSOLINE) 50 MG tablet Take 1 tablet (50 mg total) by mouth at bedtime. 90 tablet 1  . sertraline (ZOLOFT) 100 MG tablet Take 2 tablets (200 mg total) by mouth daily. 180 tablet 0   No current facility-administered medications on file prior to visit.     Objective   Vitals:   02/03/20 0929  Weight: 153 lb (69.4 kg)  Height: 5\' 5"  (1.651 m)   GEN:  The patient appears stated age and is in NAD.  Neurological examination:  Orientation: The patient is alert and oriented x3. Cranial nerves: There is good facial symmetry. There is no facial hypomimia.  The speech is fluent and clear.   Movement examination: Tone: unable Abnormal movements: mild postural tremor on the  L Coordination:  There is no decremation with RAM's     Follow up Instructions      -I discussed the assessment and treatment plan with the patient. The patient was provided an opportunity to ask questions and all were answered. The patient agreed with the plan and demonstrated an understanding of the instructions.   The patient was advised to call back or seek an in-person evaluation if the symptoms worsen or if the condition fails to improve as anticipated.     Kerin Salen, DO

## 2020-02-03 ENCOUNTER — Encounter: Payer: Self-pay | Admitting: Neurology

## 2020-02-03 ENCOUNTER — Telehealth (INDEPENDENT_AMBULATORY_CARE_PROVIDER_SITE_OTHER): Payer: Medicare Other | Admitting: Neurology

## 2020-02-03 ENCOUNTER — Other Ambulatory Visit: Payer: Self-pay

## 2020-02-03 VITALS — BP 132/83 | Ht 65.0 in | Wt 153.0 lb

## 2020-02-03 DIAGNOSIS — G250 Essential tremor: Secondary | ICD-10-CM | POA: Diagnosis not present

## 2020-02-03 MED ORDER — PRIMIDONE 50 MG PO TABS: 50.0000 mg | ORAL_TABLET | Freq: Every day | ORAL | 1 refills | Status: DC

## 2020-02-04 IMAGING — MG DIGITAL SCREENING BILATERAL MAMMOGRAM WITH TOMO AND CAD
6 of 10 series · 6 of 30 positions shown · non-contrast
Comparison: Previous exam(s).

CLINICAL DATA: Screening.

EXAM:
DIGITAL SCREENING BILATERAL MAMMOGRAM WITH TOMO AND CAD

[L CC synth-2D (1 of 2)]
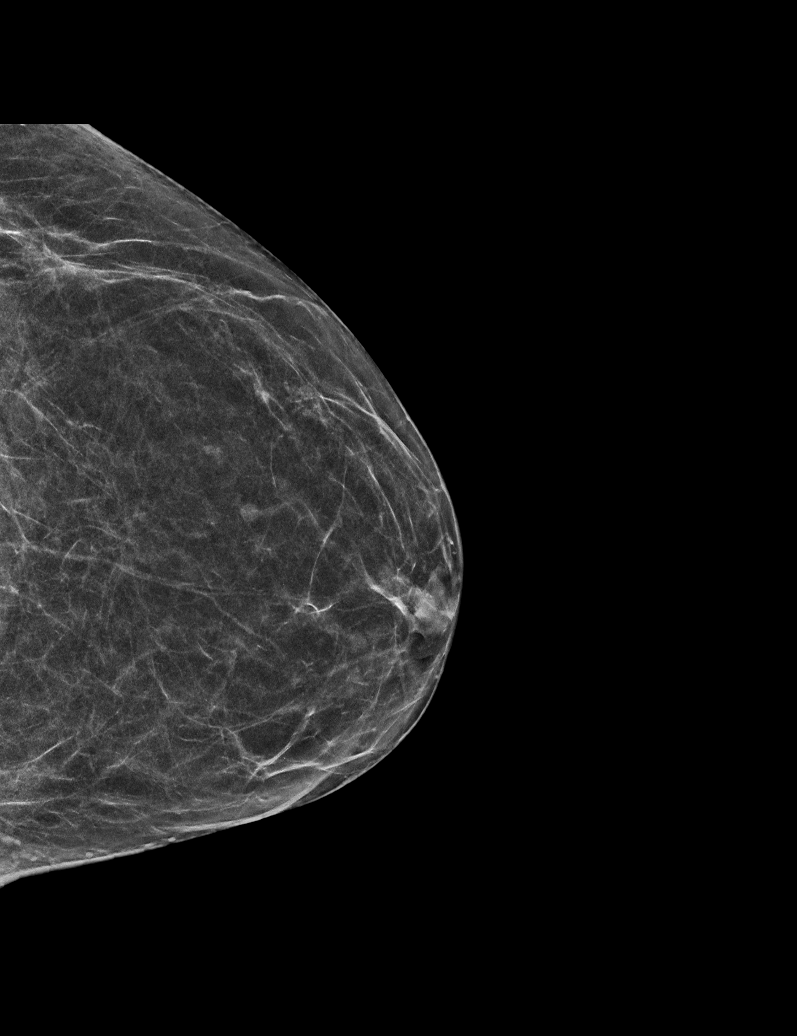

[L CC synth-2D (2 of 2)]
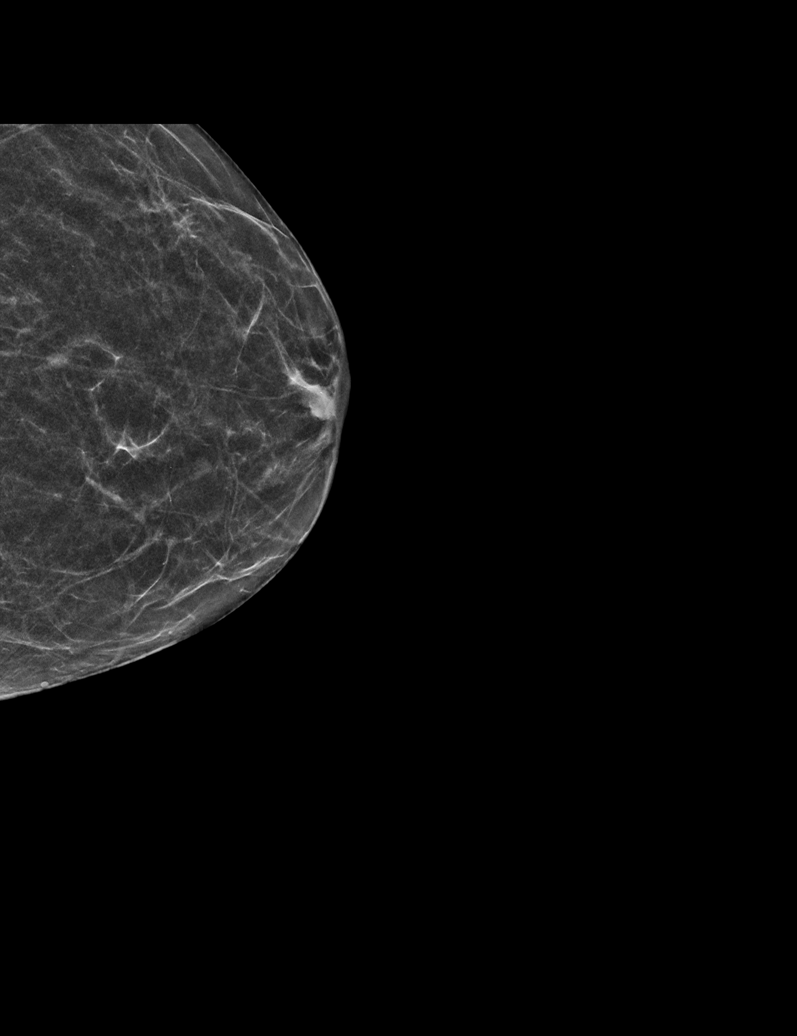

[R CC synth-2D]
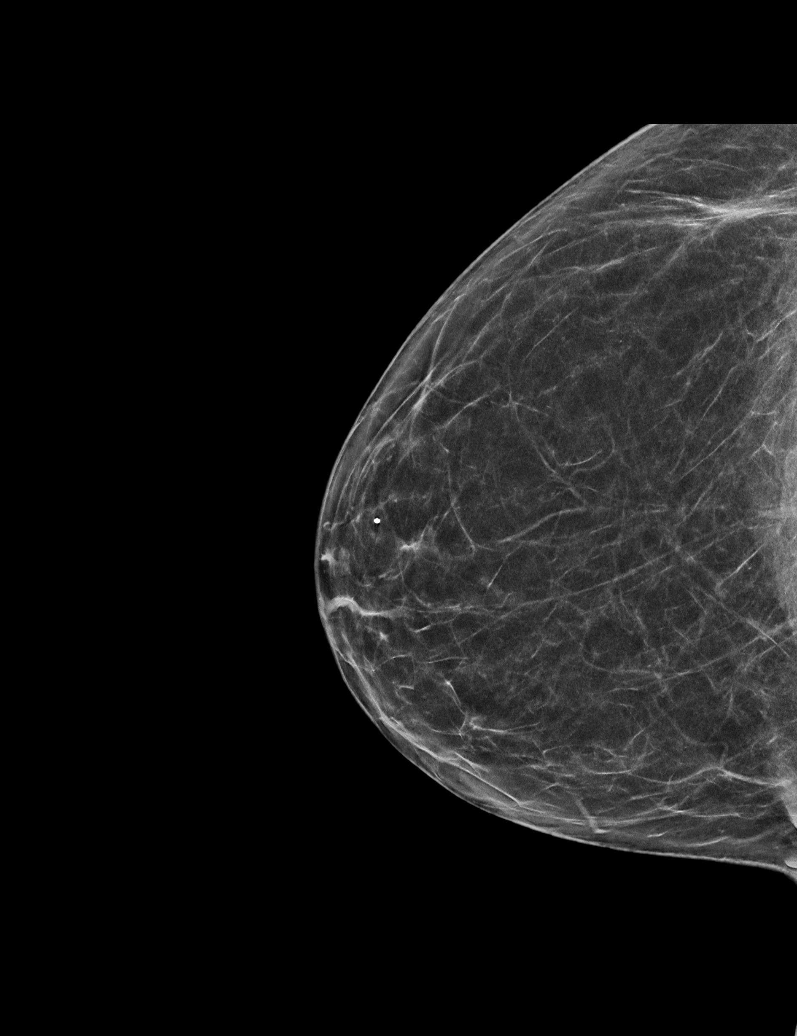

[R MLO synth-2D]
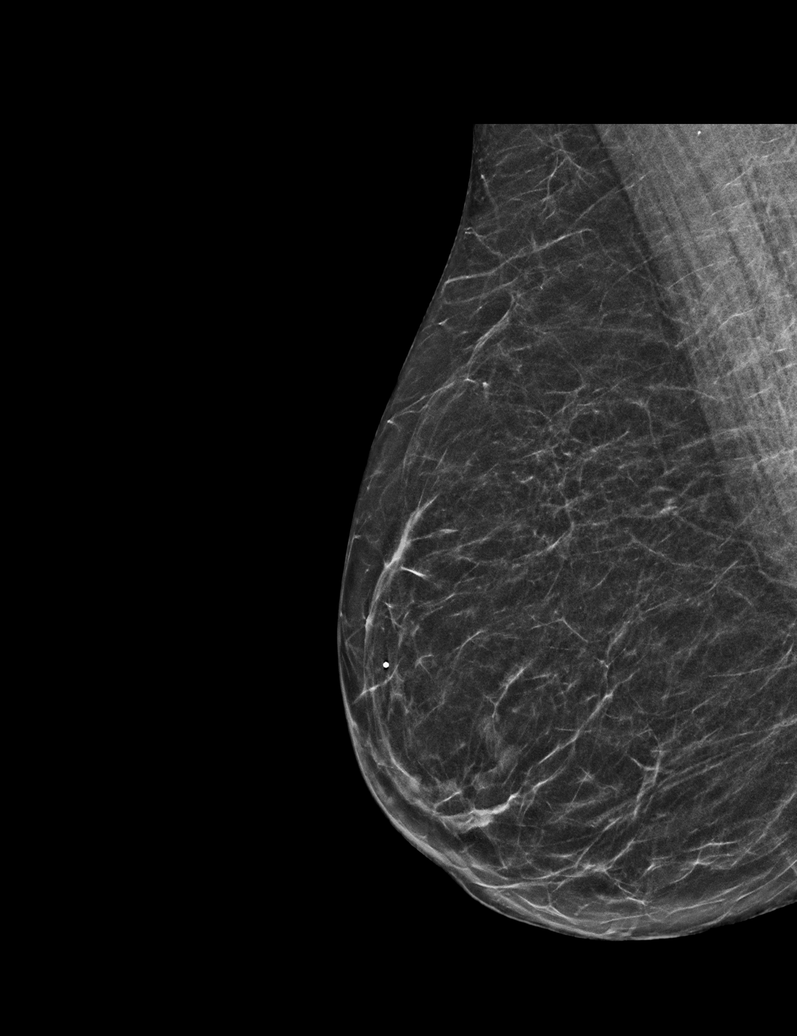

[L MLO synth-2D]
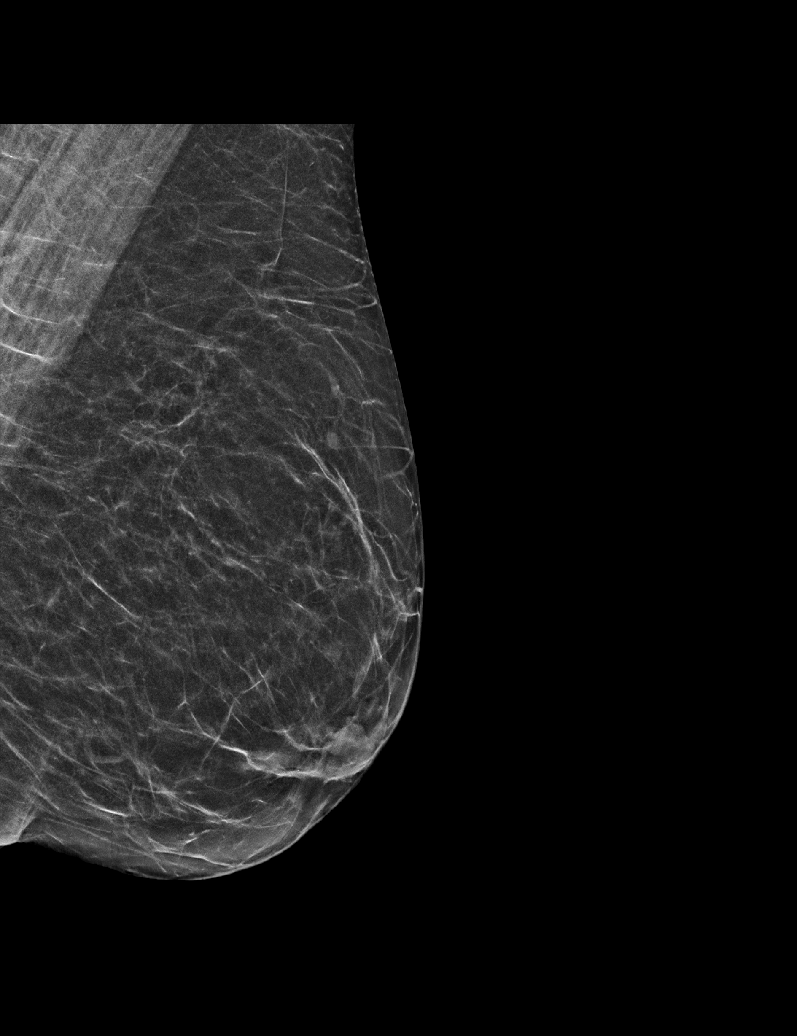

[R MLO tomo · tomo slice 25/48.0]
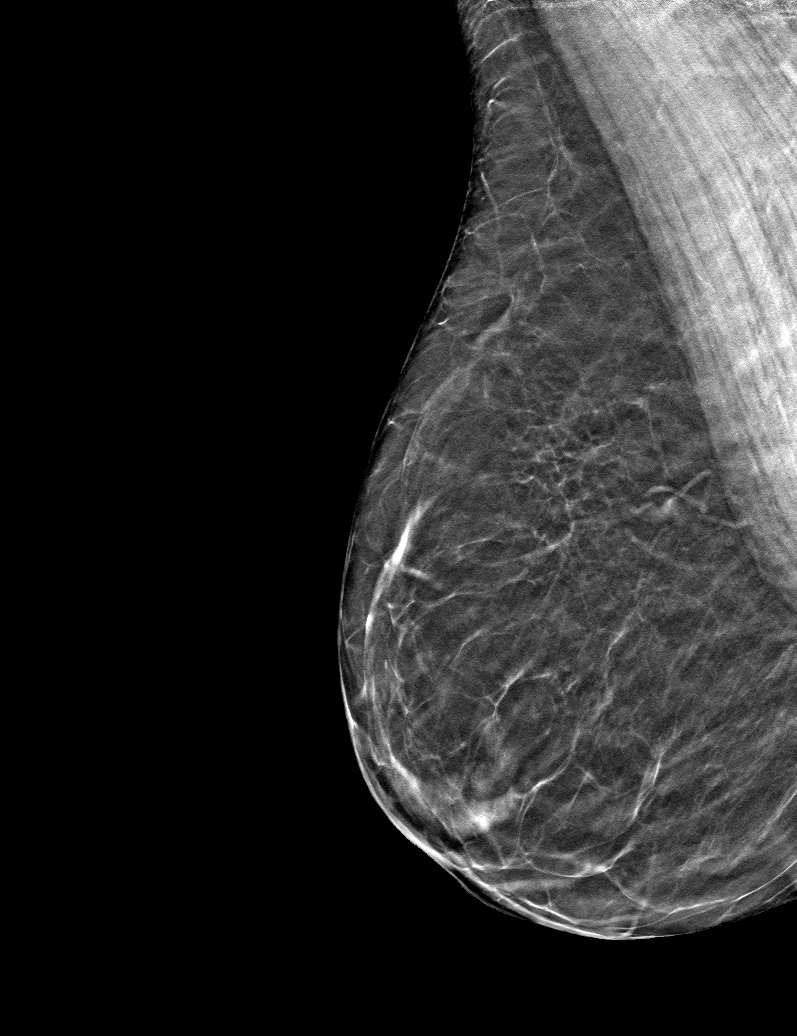

[6 of 30 positions shown; findings below may reference images not displayed]

ACR Breast Density Category b: There are scattered areas of
fibroglandular density.
FINDINGS: There are no findings suspicious for malignancy. Images were
processed with CAD.
IMPRESSION: No mammographic evidence of malignancy. A result letter of this
screening mammogram will be mailed directly to the patient.

RECOMMENDATION:
Screening mammogram in one year. (Code:[id number])

BI-RADS CATEGORY  1: Negative.

## 2020-02-18 ENCOUNTER — Other Ambulatory Visit (HOSPITAL_COMMUNITY): Payer: Self-pay | Admitting: Psychiatry

## 2020-03-08 ENCOUNTER — Other Ambulatory Visit (HOSPITAL_COMMUNITY): Payer: Self-pay | Admitting: Psychiatry

## 2020-03-09 ENCOUNTER — Other Ambulatory Visit (HOSPITAL_COMMUNITY): Payer: Self-pay | Admitting: Psychiatry

## 2020-03-09 MED ORDER — CLONAZEPAM 1 MG PO TABS: 1.0000 mg | ORAL_TABLET | Freq: Two times a day (BID) | ORAL | 0 refills | Status: DC | PRN

## 2020-03-12 ENCOUNTER — Other Ambulatory Visit: Payer: Self-pay

## 2020-03-12 ENCOUNTER — Other Ambulatory Visit: Payer: Self-pay | Admitting: Neurology

## 2020-03-12 ENCOUNTER — Telehealth (INDEPENDENT_AMBULATORY_CARE_PROVIDER_SITE_OTHER): Payer: Medicare Other | Admitting: Psychiatry

## 2020-03-12 DIAGNOSIS — F411 Generalized anxiety disorder: Secondary | ICD-10-CM

## 2020-03-12 DIAGNOSIS — F330 Major depressive disorder, recurrent, mild: Secondary | ICD-10-CM

## 2020-03-12 DIAGNOSIS — F1010 Alcohol abuse, uncomplicated: Secondary | ICD-10-CM

## 2020-03-12 MED ORDER — BUPROPION HCL ER (XL) 300 MG PO TB24: 300.0000 mg | ORAL_TABLET | Freq: Every day | ORAL | 1 refills | Status: DC

## 2020-03-12 MED ORDER — CLONAZEPAM 1 MG PO TABS: 1.0000 mg | ORAL_TABLET | Freq: Two times a day (BID) | ORAL | 2 refills | Status: DC

## 2020-03-12 MED ORDER — SERTRALINE HCL 100 MG PO TABS: 200.0000 mg | ORAL_TABLET | Freq: Every day | ORAL | 1 refills | Status: DC
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
## 2020-04-15 ENCOUNTER — Other Ambulatory Visit (HOSPITAL_COMMUNITY): Payer: Self-pay | Admitting: Psychiatry

## 2020-04-27 ENCOUNTER — Ambulatory Visit: Payer: Medicare Other | Admitting: Physician Assistant

## 2020-05-28 ENCOUNTER — Ambulatory Visit: Payer: Medicare Other | Admitting: Family Medicine

## 2020-06-12 ENCOUNTER — Telehealth (INDEPENDENT_AMBULATORY_CARE_PROVIDER_SITE_OTHER): Payer: Medicare Other | Admitting: Psychiatry

## 2020-06-12 ENCOUNTER — Other Ambulatory Visit: Payer: Self-pay

## 2020-06-12 DIAGNOSIS — F3341 Major depressive disorder, recurrent, in partial remission: Secondary | ICD-10-CM | POA: Diagnosis not present

## 2020-06-12 DIAGNOSIS — F411 Generalized anxiety disorder: Secondary | ICD-10-CM

## 2020-06-12 MED ORDER — CLONAZEPAM 1 MG PO TABS: 1.0000 mg | ORAL_TABLET | Freq: Three times a day (TID) | ORAL | 2 refills | Status: DC | PRN
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
## 2020-06-27 ENCOUNTER — Ambulatory Visit: Payer: Medicare Other | Admitting: Dermatology

## 2020-08-07 ENCOUNTER — Ambulatory Visit: Payer: Medicare Other | Admitting: Dermatology

## 2020-09-01 ENCOUNTER — Other Ambulatory Visit (HOSPITAL_COMMUNITY): Payer: Self-pay | Admitting: Psychiatry

## 2020-09-12 ENCOUNTER — Other Ambulatory Visit: Payer: Self-pay

## 2020-09-12 ENCOUNTER — Telehealth (INDEPENDENT_AMBULATORY_CARE_PROVIDER_SITE_OTHER): Payer: Medicare Other | Admitting: Psychiatry

## 2020-09-12 DIAGNOSIS — F411 Generalized anxiety disorder: Secondary | ICD-10-CM

## 2020-09-12 DIAGNOSIS — F330 Major depressive disorder, recurrent, mild: Secondary | ICD-10-CM

## 2020-09-12 MED ORDER — CLONAZEPAM 1 MG PO TABS: 1.0000 mg | ORAL_TABLET | Freq: Three times a day (TID) | ORAL | 2 refills | Status: DC | PRN

## 2020-09-12 MED ORDER — BUPROPION HCL ER (XL) 300 MG PO TB24: ORAL_TABLET | ORAL | 1 refills | Status: DC
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
## 2021-01-04 ENCOUNTER — Telehealth (HOSPITAL_COMMUNITY): Payer: Self-pay | Admitting: *Deleted

## 2021-01-04 NOTE — Telephone Encounter (Signed)
Former pt of Dr. Hinton Dyer called inquiring about seeking a new provider outside this office. Pt advised pt that when she receives letter explaining situation, it will also have referral sources. Writer advised pt call [facility] and encouraged to call office back if she needs assistance. Pt verbalizes understanding. Pt does not require refills at this time she says.

## 2021-01-07 NOTE — Telephone Encounter (Signed)
Thanks

## 2021-01-08 ENCOUNTER — Telehealth: Payer: Self-pay

## 2021-01-08 NOTE — Telephone Encounter (Signed)
Called pt to clarify "disccus issues" written in appt note. Pt stated that she will CB to resched appt and would like to cancel appt on 6/8 due to having a long drive. Pt was reminded that she may run out of medication and would need an appt for additional refills.

## 2021-01-09 ENCOUNTER — Ambulatory Visit: Payer: Medicare Other | Admitting: Family Medicine

## 2021-01-10 ENCOUNTER — Other Ambulatory Visit: Payer: Self-pay

## 2021-02-05 ENCOUNTER — Encounter: Payer: Self-pay | Admitting: Family Medicine

## 2021-02-05 ENCOUNTER — Other Ambulatory Visit: Payer: Self-pay

## 2021-02-05 ENCOUNTER — Ambulatory Visit (INDEPENDENT_AMBULATORY_CARE_PROVIDER_SITE_OTHER): Payer: Medicare Other | Admitting: Family Medicine

## 2021-02-05 VITALS — BP 109/75 | HR 65 | Temp 98.0°F | Ht 65.0 in | Wt 163.0 lb

## 2021-02-05 DIAGNOSIS — E663 Overweight: Secondary | ICD-10-CM

## 2021-02-05 DIAGNOSIS — E039 Hypothyroidism, unspecified: Secondary | ICD-10-CM

## 2021-02-05 DIAGNOSIS — E559 Vitamin D deficiency, unspecified: Secondary | ICD-10-CM

## 2021-02-05 DIAGNOSIS — Z79899 Other long term (current) drug therapy: Secondary | ICD-10-CM | POA: Diagnosis not present

## 2021-02-05 DIAGNOSIS — N1832 Chronic kidney disease, stage 3b: Secondary | ICD-10-CM

## 2021-02-05 LAB — COMPREHENSIVE METABOLIC PANEL
ALT: 17 U/L (ref 0–35)
AST: 20 U/L (ref 0–37)
Albumin: 4.4 g/dL (ref 3.5–5.2)
Alkaline Phosphatase: 68 U/L (ref 39–117)
BUN: 21 mg/dL (ref 6–23)
CO2: 27 mEq/L (ref 19–32)
Calcium: 9.3 mg/dL (ref 8.4–10.5)
Chloride: 103 mEq/L (ref 96–112)
Creatinine, Ser: 1.26 mg/dL — ABNORMAL HIGH (ref 0.40–1.20)
GFR: 45.9 mL/min — ABNORMAL LOW (ref >60.00)
Glucose, Bld: 83 mg/dL (ref 70–99)
Potassium: 4.2 mEq/L (ref 3.5–5.1)
Sodium: 139 mEq/L (ref 135–145)
Total Bilirubin: 0.4 mg/dL (ref 0.2–1.2)
Total Protein: 6.7 g/dL (ref 6.0–8.3)

## 2021-02-05 LAB — CBC WITH DIFFERENTIAL/PLATELET
Basophils Absolute: 0.1 10*3/uL (ref 0.0–0.1)
Basophils Relative: 2.8 % (ref 0.0–3.0)
Eosinophils Absolute: 0.2 10*3/uL (ref 0.0–0.7)
Eosinophils Relative: 3.8 % (ref 0.0–5.0)
HCT: 38 % (ref 36.0–46.0)
Hemoglobin: 13 g/dL (ref 12.0–15.0)
Lymphocytes Relative: 35.7 % (ref 12.0–46.0)
Lymphs Abs: 1.5 10*3/uL (ref 0.7–4.0)
MCHC: 34.1 g/dL (ref 30.0–36.0)
MCV: 95.4 fl (ref 78.0–100.0)
Monocytes Absolute: 0.2 10*3/uL (ref 0.1–1.0)
Monocytes Relative: 5.7 % (ref 3.0–12.0)
Neutro Abs: 2.2 10*3/uL (ref 1.4–7.7)
Neutrophils Relative %: 52 % (ref 43.0–77.0)
Platelets: 280 10*3/uL (ref 150.0–400.0)
RBC: 3.99 Mil/uL (ref 3.87–5.11)
RDW: 13.7 % (ref 11.5–15.5)
WBC: 4.3 10*3/uL (ref 4.0–10.5)

## 2021-02-05 LAB — LIPID PANEL
Cholesterol: 282 mg/dL — ABNORMAL HIGH (ref 0–200)
HDL: 68.2 mg/dL (ref >39.00)
LDL Cholesterol: 187 mg/dL — ABNORMAL HIGH (ref 0–99)
NonHDL: 214.1
Total CHOL/HDL Ratio: 4
Triglycerides: 134 mg/dL (ref 0.0–149.0)
VLDL: 26.8 mg/dL (ref 0.0–40.0)

## 2021-02-05 LAB — TSH: TSH: 99.66 u[IU]/mL — ABNORMAL HIGH (ref 0.35–5.50)

## 2021-02-05 LAB — T4, FREE: Free T4: 0.44 ng/dL — ABNORMAL LOW (ref 0.60–1.60)

## 2021-02-05 LAB — VITAMIN D 25 HYDROXY (VIT D DEFICIENCY, FRACTURES): VITD: 20.43 ng/mL — ABNORMAL LOW (ref 30.00–100.00)

## 2021-02-05 NOTE — Progress Notes (Signed)
This visit occurred during the SARS-CoV-2 public health emergency.  Safety protocols were in place, including screening questions prior to the visit, additional usage of staff PPE, and extensive cleaning of exam room while observing appropriate contact time as indicated for disinfecting solutions.    Katie Rojas , 1961/05/31, 60 y.o., female MRN: [id number] Patient Care Team    Relationship Specialty Notifications Start End  Ma Hillock, DO PCP - General Family Medicine  10/21/18   Stephanie Acre, MD (Inactive) Consulting Physician Psychiatry  10/21/18   Gastroenterology, [facility]ie [facility]    10/22/18   Ludwig Clarks, Johnson Physician Neurology  09/06/19     Chief Complaint  Patient presents with   Hypothyroidism    [place]; pt is not fasting      Subjective: Katie Rojas is a 60 y.o. female present for [facility] Hypothyroidism, unspecified type She reports compliance with levo 112 mcg qd. No palpitations.  - T4, free - TSH  Vitamin D deficiency/Stage 3b chronic kidney disease ([place]) Has not been taking her vitd for many months.  Depression screen [facility] 2/9 02/02/2019 10/21/2018  Decreased Interest 2 2  Down, Depressed, Hopeless 3 2  PHQ - 2 Score 5 4  Altered sleeping 1 2  Tired, decreased energy 2 1  Change in appetite 2 2  Feeling bad or failure about yourself  3 2  Trouble concentrating 2 2  Moving slowly or fidgety/restless 0 0  Suicidal thoughts 1 0  PHQ-9 Score 16 13  Difficult doing work/chores Very difficult Somewhat difficult    Allergies  Allergen Reactions   Relafen [Nabumetone] Other (See Comments)    Stomach upset   Social History   Social History Narrative   Marital status/children/pets: Married   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    Hepatitis B 1979   History of colon polyps    Hyperthyroidism 2005   Nuclear rad tx (I131)-- on synthoid   Obsessive-compulsive disorder    Ophthalmic Graves disease 2005   Right shoulder pain 11/12/2017   Normal x-ray.  Mild spurring of AC joint of shoulder.   Past Surgical History:  Procedure Laterality Date   ABDOMINAL HYSTERECTOMY     BLEPHAROPLASTY Bilateral 2005   Secondary from proptosis from ocular Graves' disease.   ORBITAL OSTEOTOMY Bilateral    Ocular Graves' disease   WISDOM TOOTH EXTRACTION     Family History  Problem Relation Age of Onset   Schizophrenia Paternal Grandmother    Arthritis Paternal Grandmother    Drug abuse Brother    Alcohol abuse Brother    Learning disabilities Sister    COPD Mother    Hypertension Mother    Miscarriages / [place] Mother    Lung cancer Mother    Tremor Mother        bilateral hands   Early death Father    Hypertension Father    Pancreatitis Father    Depression Daughter    Alcohol abuse Maternal Grandmother    Hearing loss Maternal Grandmother    Alcoholism Maternal Grandfather  Hearing loss Paternal Grandfather    Stroke Paternal Grandfather    Depression Sister    Tremor Sister        left hand- oldest of her sisters   Alcohol abuse Sister    Tremor Nephew        hand; youngest sisters son   Allergies as of 02/05/2021       Reactions   Relafen [nabumetone] Other (See Comments)   Stomach upset        Medication List        Accurate as of February 05, 2021 11:06 AM. If you have any questions, ask your nurse or doctor.          STOP taking these medications    hydrOXYzine 10 MG tablet Commonly known as: ATARAX/VISTARIL Stopped by: Howard Pouch, DO   primidone 50 MG tablet Commonly known as: MYSOLINE Stopped by: Howard Pouch, DO       TAKE these medications     buPROPion 300 MG 24 hr tablet Commonly known as: WELLBUTRIN XL TAKE 1 TABLET(300 MG) BY MOUTH DAILY   clonazePAM 1 MG tablet Commonly known as: KLONOPIN Take 1 tablet (1 mg total) by mouth 3 (three) times daily as needed for anxiety.   doxycycline 100 MG tablet Commonly known as: VIBRA-TABS Take 100 mg by mouth 2 (two) times daily.   levothyroxine 112 MCG tablet Commonly known as: SYNTHROID Take 1 tablet (112 mcg total) by mouth daily before breakfast. GENERIC OKAY   sertraline 100 MG tablet Commonly known as: ZOLOFT TAKE 2 TABLETS BY MOUTH EVERY DAY   triamcinolone ointment 0.1 % Commonly known as: KENALOG Apply 1 application topically 3 (three) times daily.   Vitamin D3 125 MCG (5000 UT) Tabs Take 1 tablet (5,000 Units total) by mouth daily.        All past medical history, surgical history, allergies, family history, immunizations andmedications were updated in the EMR today and reviewed under the history and medication portions of their EMR.     ROS: Negative, with the exception of above mentioned in HPI   Objective:  BP 109/75   Pulse 65   Temp 98 F (36.7 C) (Oral)   Ht 5' 5"  (1.651 m)   Wt 163 lb (73.9 kg)   SpO2 99%   BMI 27.12 kg/m  Body mass index is 27.12 kg/m. Gen: Afebrile. No acute distress. Nontoxic, pleasant female.  HENT: AT. [place].  Eyes:Pupils Equal Round Reactive to light, Extraocular movements intact,  Conjunctiva without redness, discharge or icterus. Neck/lymp/endocrine: Supple,no lymphadenopathy, no thyromegaly CV: RRR no murmur, no edema, +2/4 P posterior tibialis pulses Chest: CTAB, no wheeze or crackles Skin: no rashes, purpura or petechiae.  Neuro: Normal gait. PERLA. EOMi. Alert. Oriented x3 Psych: Normal affect, dress and demeanor. Normal speech. Normal thought content and judgment.   No results found. No results found. No results found for this or any previous visit (from the past 24 hour(s)).  Assessment/Plan: [name] is a 60 y.o. female present for OV for  hypothyroidism TSH, T4 free collected today- refills will be provided after results on levo. Currently reports compliance with levo 112 mcg.   Vit d def/Stage 3b chronic kidney disease ([place]):  She has not been taking her vit d for many months.  Daily dosing will be prescribed after laboratory results received. - VITAMIN D 25 Hydroxy (Vit-D Deficiency, Fractures) - CBC w/Diff - PTH, Intact and Calcium  Overweight (BMI 25.0-29.9) - Lipid panel - fasting  except a diet pepsi  Encounter for long-term current use of medication - Comp Met (CMET)   Reviewed expectations re: course of current medical issues. Discussed self-management of symptoms. Outlined signs and symptoms indicating need for more acute intervention. Patient verbalized understanding and all questions were answered. Patient received an After-Visit Summary.    No orders of the defined types were placed in this encounter. No orders of the defined types were placed in this encounter.  Referral Orders  No referral(s) requested today    Note is dictated utilizing voice recognition software. Although note has been proof read prior to signing, occasional typographical errors still can be missed. If any questions arise, please do not hesitate to call for verification.   electronically signed by:  Howard Pouch, DO  [place]

## 2021-02-05 NOTE — Patient Instructions (Addendum)
Great to see you today.  I have refilled the medication(s) we provide.   If labs were collected, we will inform you of lab results once received either by echart message or telephone call.   - echart message- for normal results that have been seen by the patient already.   - telephone call: abnormal results or if patient has not viewed results in their echart.  

## 2021-02-06 ENCOUNTER — Telehealth: Payer: Self-pay | Admitting: Family Medicine

## 2021-02-06 DIAGNOSIS — Z91199 Patient's noncompliance with other medical treatment and regimen due to unspecified reason: Secondary | ICD-10-CM | POA: Insufficient documentation

## 2021-02-06 MED ORDER — ATORVASTATIN CALCIUM 20 MG PO TABS: 20.0000 mg | ORAL_TABLET | Freq: Every day | ORAL | 3 refills | Status: DC

## 2021-02-06 MED ORDER — LEVOTHYROXINE SODIUM 112 MCG PO TABS: 112.0000 ug | ORAL_TABLET | Freq: Every day | ORAL | 3 refills | Status: DC

## 2021-02-06 NOTE — Telephone Encounter (Signed)
Spoke with pt regarding labs and instructions. Pt states she has not been taking her meds daily but when she does she doubles the medication. She misses meds approx QOD.

## 2021-02-06 NOTE — Telephone Encounter (Signed)
Please call patient: Please ask her if she has been taking her Synthroid daily on an empty stomach.  This is her thyroid medication.  It is very important that we know exactly how she has been taking this since her thyroid levels are extremely abnormal today. Before I can call in a refill on her thyroid medication I need to know this information so I know if I need to alter the dose.  Her vitamin D levels are low at 20.  I have called in refills for her vitamin D and I encouraged her to restart these daily. Her cholesterol levels are also extremely high, she is at a very high cardiovascular risk for heart attack or stroke in the next 10 years with a LDL level of 187.  I have called in a medicine called atorvastatin for her to take 1 tab before bed to help lower her cholesterol and provide her cardiovascular protection.   Follow-up in 8-10 weeks with provider, we will need to recheck labs at that visit as well.

## 2021-02-06 NOTE — Telephone Encounter (Signed)
To her noncompliance.  We will keep dose the same for her thyroid.  I have refilled this today.  We will taper if needed on follow-up.

## 2021-02-06 NOTE — Addendum Note (Signed)
Addended by: Felix Pacini A on: 02/06/2021 05:02 PM   Modules accepted: Orders

## 2021-02-07 LAB — PTH, INTACT AND CALCIUM: PTH: 65 pg/mL (ref 16–77)

## 2021-02-07 LAB — TIQ-NTM

## 2021-02-18 ENCOUNTER — Telehealth: Payer: Self-pay

## 2021-02-18 ENCOUNTER — Telehealth (HOSPITAL_COMMUNITY): Payer: Self-pay | Admitting: *Deleted

## 2021-02-18 MED ORDER — CLONAZEPAM 1 MG PO TABS: 1.0000 mg | ORAL_TABLET | Freq: Three times a day (TID) | ORAL | 0 refills | Status: AC | PRN

## 2021-02-18 NOTE — Telephone Encounter (Signed)
[id number], [address] in [place], [place]. We were told you are covering for today. Thank you!

## 2021-02-18 NOTE — Telephone Encounter (Signed)
Patient returned call and I relayed message to call Dr. Stanton Kidney office regarding this medication. Patient verbalized understanding.

## 2021-02-18 NOTE — Telephone Encounter (Signed)
Patient refill request.   clonazePAM (KLONOPIN) 1 MG tablet  [id number] - [place], [place] - [address] AT [facility]

## 2021-02-18 NOTE — Telephone Encounter (Signed)
Former pt of Dr. Hinton Dyer who has received certified letter and is now calling requesting refill of the Klonopin 1 mg tid, 30 day supply. Please review. Thanks.

## 2021-02-18 NOTE — Telephone Encounter (Signed)
LVM for pt to CB regarding medication request.   Note: Pt will need to contact [facility]

## 2021-02-18 NOTE — Telephone Encounter (Signed)
noted 

## 2021-04-16 ENCOUNTER — Encounter: Payer: Self-pay | Admitting: Family Medicine

## 2021-04-16 ENCOUNTER — Other Ambulatory Visit: Payer: Self-pay

## 2021-04-16 ENCOUNTER — Ambulatory Visit (INDEPENDENT_AMBULATORY_CARE_PROVIDER_SITE_OTHER): Payer: Medicare Other | Admitting: Family Medicine

## 2021-04-16 VITALS — BP 109/76 | HR 64 | Temp 97.7°F | Wt 156.2 lb

## 2021-04-16 DIAGNOSIS — E559 Vitamin D deficiency, unspecified: Secondary | ICD-10-CM | POA: Diagnosis not present

## 2021-04-16 DIAGNOSIS — E890 Postprocedural hypothyroidism: Secondary | ICD-10-CM | POA: Diagnosis not present

## 2021-04-16 DIAGNOSIS — E782 Mixed hyperlipidemia: Secondary | ICD-10-CM | POA: Diagnosis not present

## 2021-04-16 DIAGNOSIS — Z79899 Other long term (current) drug therapy: Secondary | ICD-10-CM

## 2021-04-16 DIAGNOSIS — Z23 Encounter for immunization: Secondary | ICD-10-CM | POA: Diagnosis not present

## 2021-04-16 NOTE — Patient Instructions (Addendum)
Great to see you today.  I have refilled the medication(s) we provide.   If labs were collected, we will inform you of lab results once received either by echart message or telephone call.   - echart message- for normal results that have been seen by the patient already.   - telephone call: abnormal results or if patient has not viewed results in their echart.   High Cholesterol High cholesterol is a condition in which the blood has high levels of a white, waxy substance similar to fat (cholesterol). The liver makes all the cholesterol that the body needs. The human body needs small amounts of cholesterol to help build cells. A person gets extra or excess cholesterol from the food that he or she eats. The blood carries cholesterol from the liver to the rest of the body. If you have high cholesterol, deposits (plaques) may build up on the walls of your arteries. Arteries are the blood vessels that carry blood away from your heart. These plaques make the arteries narrow and stiff. Cholesterol plaques increase your risk for heart attack and stroke. Work with your health care provider to keep your cholesterol levels in a healthy range. What increases the risk? The following factors may make you more likely to develop this condition: Eating foods that are high in animal fat (saturated fat) or cholesterol. Being overweight. Not getting enough exercise. A family history of high cholesterol (familial hypercholesterolemia). Use of tobacco products. Having diabetes. What are the signs or symptoms? In most cases, high cholesterol does not usually cause any symptoms. In severe cases, very high cholesterol levels can cause: Fatty bumps under the skin (xanthomas). A white or gray ring around the black center (pupil) of the eye. How is this diagnosed? This condition may be diagnosed based on the results of a blood test. If you are older than 62 years of age, your health care provider may check your  cholesterol levels every 4-6 years. You may be checked more often if you have high cholesterol or other risk factors for heart disease. The blood test for cholesterol measures: "Bad" cholesterol, or LDL cholesterol. This is the main type of cholesterol that causes heart disease. The desired level is less than 100 mg/dL (2.59 mmol/L). "Good" cholesterol, or HDL cholesterol. HDL helps protect against heart disease by cleaning the arteries and carrying the LDL to the liver for processing. The desired level for HDL is 60 mg/dL (1.55 mmol/L) or higher. Triglycerides. These are fats that your body can store or burn for energy. The desired level is less than 150 mg/dL (1.69 mmol/L). Total cholesterol. This measures the total amount of cholesterol in your blood and includes LDL, HDL, and triglycerides. The desired level is less than 200 mg/dL (5.17 mmol/L). How is this treated? Treatment for high cholesterol starts with lifestyle changes, such as diet and exercise. Diet changes. You may be asked to eat foods that have more fiber and less saturated fats or added sugar. Lifestyle changes. These may include regular exercise, maintaining a healthy weight, and quitting use of tobacco products. Medicines. These are given when diet and lifestyle changes have not worked. You may be prescribed a statin medicine to help lower your cholesterol levels. Follow these instructions at home: Eating and drinking  Eat a healthy, balanced diet. This diet includes: Daily servings of a variety of fresh, frozen, or canned fruits and vegetables. Daily servings of whole grain foods that are rich in fiber. Foods that are low in saturated fats   and trans fats. These include poultry and fish without skin, lean cuts of meat, and low-fat dairy products. A variety of fish, especially oily fish that contain omega-3 fatty acids. Aim to eat fish at least 2 times a week. Avoid foods and drinks that have added sugar. Use healthy cooking  methods, such as roasting, grilling, broiling, baking, poaching, steaming, and stir-frying. Do not fry your food except for stir-frying. If you drink alcohol: Limit how much you have to: 0-1 drink a day for women who are not pregnant. 0-2 drinks a day for men. Know how much alcohol is in a drink. In the [place] one drink equals one 12 oz bottle of beer (355 mL), one 5 oz glass of wine (148 mL), or one 1 oz glass of hard liquor (44 mL). Lifestyle  Get regular exercise. Aim to exercise for a total of 150 minutes a week. Increase your activity level by doing activities such as gardening, walking, and taking the stairs. Do not use any products that contain nicotine or tobacco. These products include cigarettes, chewing tobacco, and vaping devices, such as e-cigarettes. If you need help quitting, ask your health care provider. General instructions Take over-the-counter and prescription medicines only as told by your health care provider. Keep all follow-up visits. This is important. Where to find more information American Heart Association: www.heart.org National Heart, Lung, and Blood Institute: www.nhlbi.nih.gov Contact a health care provider if: You have trouble achieving or maintaining a healthy diet or weight. You are starting an exercise program. You are unable to stop smoking. Get help right away if: You have chest pain. You have trouble breathing. You have discomfort or pain in your jaw, neck, back, shoulder, or arm. You have any symptoms of a stroke. "BE FAST" is an easy way to remember the main warning signs of a stroke: B - Balance. Signs are dizziness, sudden trouble walking, or loss of balance. E - Eyes. Signs are trouble seeing or a sudden change in vision. F - Face. Signs are sudden weakness or numbness of the face, or the face or eyelid drooping on one side. A - Arms. Signs are weakness or numbness in an arm. This happens suddenly and usually on one side of the body. S -  Speech. Signs are sudden trouble speaking, slurred speech, or trouble understanding what people say. T - Time. Time to call emergency services. Write down what time symptoms started. You have other signs of a stroke, such as: A sudden, severe headache with no known cause. Nausea or vomiting. Seizure. These symptoms may represent a serious problem that is an emergency. Do not wait to see if the symptoms will go away. Get medical help right away. Call your local emergency services (911 in the [place] Do not drive yourself to the hospital. Summary Cholesterol plaques increase your risk for heart attack and stroke. Work with your health care provider to keep your cholesterol levels in a healthy range. Eat a healthy, balanced diet, get regular exercise, and maintain a healthy weight. Do not use any products that contain nicotine or tobacco. These products include cigarettes, chewing tobacco, and vaping devices, such as e-cigarettes. Get help right away if you have any symptoms of a stroke. This information is not intended to replace advice given to you by your health care provider. Make sure you discuss any questions you have with your health care provider. Document Revised: 10/04/2020 Document Reviewed: 09/24/2020 Elsevier Patient Education  2022 [employer]  

## 2021-04-16 NOTE — Progress Notes (Signed)
This visit occurred during the SARS-CoV-2 public health emergency.  Safety protocols were in place, including screening questions prior to the visit, additional usage of staff PPE, and extensive cleaning of exam room while observing appropriate contact time as indicated for disinfecting solutions.    Katie Rojas , 1960/10/02, 60 y.o., female MRN: [id number] Patient Care Team    Relationship Specialty Notifications Start End  Natalia Leatherwood, DO PCP - General Family Medicine  10/21/18   Magdalene Patricia, MD (Inactive) Consulting Physician Psychiatry  10/21/18   Gastroenterology, Deboraha Sprang    10/22/18   Vladimir Faster, DO Consulting Physician Neurology  09/06/19     Chief Complaint  Patient presents with   Follow-up    TSH     Subjective: Katie Rojas is a 61 y.o. female present for [facility] Hypothyroidism, unspecified type She reported NON- compliance with levo 112 mcg qd after last labs. She states she is taking medication as prescribed now.  No palpitations. Feels she has more energy and lost weight.   HLD: Started atorvastatin 20 mg qhs 2 months ago.She states she is tolerating med. Fhx hyperlipidemia.   Vitamin D deficiency/Stage 3b chronic kidney disease (HCC) Has not been taking her vitd for many months prior to last collection and is taking now.   Depression screen [facility] 2/9 04/16/2021 02/05/2021 02/02/2019 10/21/2018  Decreased Interest 2 3 2 2   Down, Depressed, Hopeless 2 3 3 2   PHQ - 2 Score 4 6 5 4   Altered sleeping 1 1 1 2   Tired, decreased energy 2 3 2 1   Change in appetite 1 2 2 2   Feeling bad or failure about yourself  2 3 3 2   Trouble concentrating 1 2 2 2   Moving slowly or fidgety/restless 1 2 0 0  Suicidal thoughts 1 1 1  0  PHQ-9 Score 13 20 16 13   Difficult doing work/chores - - Very difficult Somewhat difficult    Allergies  Allergen Reactions   Relafen [Nabumetone] Other (See Comments)    Stomach upset   Social History   Social History  Narrative   Marital status/children/pets: Married   Education/employment: Associates degree in , retired   :      -smoke alarm in the home:Yes     - wears seatbelt: Yes     - Feels safe in their relationships: Yes   Past Medical History:  Diagnosis Date   Chicken pox    Depression with anxiety    Fracture of 5th metatarsal 2017   rigth ankle sprain and 5th meatarsal fx- managed with boot.    Hepatitis B 1979   History of colon polyps    Hyperthyroidism 2005   Nuclear rad tx (I131)-- on synthoid   Obsessive-compulsive disorder    Ophthalmic Graves disease 2005   Right shoulder pain 11/12/2017   Normal x-ray.  Mild spurring of AC joint of shoulder.   Past Surgical History:  Procedure Laterality Date   ABDOMINAL HYSTERECTOMY     BLEPHAROPLASTY Bilateral 2005   Secondary from proptosis from ocular Graves' disease.   ORBITAL OSTEOTOMY Bilateral    Ocular Graves' disease   WISDOM TOOTH EXTRACTION     Family History  Problem Relation Age of Onset   Schizophrenia Paternal Grandmother    Arthritis Paternal Grandmother    Drug abuse Brother    Alcohol abuse Brother    Learning disabilities Sister    COPD Mother    Hypertension Mother  Miscarriages / Stillbirths Mother    Lung cancer Mother    Tremor Mother        bilateral hands   Early death Father    Hypertension Father    Pancreatitis Father    Depression Daughter    Alcohol abuse Maternal Grandmother    Hearing loss Maternal Grandmother    Alcoholism Maternal Grandfather    Hearing loss Paternal Grandfather    Stroke Paternal Grandfather    Depression Sister    Tremor Sister        left hand- oldest of her sisters   Alcohol abuse Sister    Tremor Nephew        hand; youngest sisters son   Allergies as of 04/16/2021       Reactions   Relafen [nabumetone] Other (See Comments)   Stomach upset        Medication List        Accurate as of April 16, 2021  2:37 PM. If you have any  questions, ask your nurse or doctor.          STOP taking these medications    triamcinolone ointment 0.1 % Commonly known as: KENALOG Stopped by: Felix Pacini, DO       TAKE these medications    atorvastatin 20 MG tablet Commonly known as: LIPITOR Take 1 tablet (20 mg total) by mouth daily.   buPROPion 300 MG 24 hr tablet Commonly known as: WELLBUTRIN XL TAKE 1 TABLET(300 MG) BY MOUTH DAILY   clonazePAM 1 MG tablet Commonly known as: KLONOPIN Take 1 tablet (1 mg total) by mouth 3 (three) times daily as needed for anxiety. What changed: when to take this   levothyroxine 112 MCG tablet Commonly known as: SYNTHROID Take 1 tablet (112 mcg total) by mouth daily before breakfast. GENERIC OKAY   sertraline 100 MG tablet Commonly known as: ZOLOFT TAKE 2 TABLETS BY MOUTH EVERY DAY   Vitamin D3 125 MCG (5000 UT) Tabs Take 1 tablet (5,000 Units total) by mouth daily.        All past medical history, surgical history, allergies, family history, immunizations andmedications were updated in the EMR today and reviewed under the history and medication portions of their EMR.     ROS: Negative, with the exception of above mentioned in HPI   Objective:  BP 109/76   Pulse 64   Temp 97.7 F (36.5 C)   Wt 156 lb 3.2 oz (70.9 kg)   SpO2 100%   BMI 25.99 kg/m  Body mass index is 25.99 kg/m. Gen: Afebrile. No acute distress.  Nontoxic, pleasant female.  HENT: AT. [place].  Eyes:Pupils Equal Round Reactive to light, Extraocular movements intact,  Conjunctiva without redness, discharge or icterus. Neck/lymp/endocrine: Supple,no lymphadenopathy, no thyromegaly CV: RRR no murmur, no edema, +2/4 P posterior tibialis pulses Chest: CTAB, no wheeze or crackles Skin: no rashes, purpura or petechiae.  Neuro:  Normal gait. PERLA. EOMi. Alert. Oriented x3 Psych: Normal affect, dress and demeanor. Normal speech. Normal thought content and judgment. No results found. No results found. No  results found for this or any previous visit (from the past 24 hour(s)).  Assessment/Plan: Katie Rojas is a 60 y.o. female present for OV for  hypothyroidism TSH, T4 free collected again today- refills will be provided after results on levo. Currently reports compliance with levo 112 mcg!  Vit d def/Stage 3b chronic kidney disease (HCC):  She has not been taking her vit d for many months.  Daily dosing will be prescribed after laboratory results received. - VITAMIN D 25 Hydroxy (Vit-D Deficiency, Fractures)  HLD: - tolerating lipitor 20 mg qhs, continue.  - LDL, LFT collected today - discussed heart healthy diet and exercise.  - follow yearly once at goal of LDL 100-130.    Influenza vaccine administered today.   Reviewed expectations re: course of current medical issues. Discussed self-management of symptoms. Outlined signs and symptoms indicating need for more acute intervention. Patient verbalized understanding and all questions were answered. Patient received an After-Visit Summary.    Orders Placed This Encounter  Procedures   Flu Vaccine QUAD 49mo+IM (Fluarix, Fluzone & Alfiuria Quad PF)   TSH   Hepatic function panel   Direct LDL   No orders of the defined types were placed in this encounter.  Referral Orders  No referral(s) requested today    Note is dictated utilizing voice recognition software. Although note has been proof read prior to signing, occasional typographical errors still can be missed. If any questions arise, please do not hesitate to call for verification.   electronically signed by:  Felix Pacini, DO  [facility] [place]

## 2021-04-17 ENCOUNTER — Telehealth: Payer: Self-pay | Admitting: Family Medicine

## 2021-04-17 LAB — HEPATIC FUNCTION PANEL
AG Ratio: 1.8 (calc) (ref 1.0–2.5)
ALT: 14 U/L (ref 6–29)
AST: 18 U/L (ref 10–35)
Albumin: 4.4 g/dL (ref 3.6–5.1)
Alkaline phosphatase (APISO): 63 U/L (ref 37–153)
Bilirubin, Direct: 0.1 mg/dL (ref 0.0–0.2)
Globulin: 2.4 g/dL (calc) (ref 1.9–3.7)
Indirect Bilirubin: 0.3 mg/dL (calc) (ref 0.2–1.2)
Total Bilirubin: 0.4 mg/dL (ref 0.2–1.2)
Total Protein: 6.8 g/dL (ref 6.1–8.1)

## 2021-04-17 LAB — LDL CHOLESTEROL, DIRECT: Direct LDL: 97 mg/dL (ref <100)

## 2021-04-17 LAB — TSH: TSH: 2.3 mIU/L (ref 0.40–4.50)

## 2021-04-17 MED ORDER — VITAMIN D3 125 MCG (5000 UT) PO TABS: 1.0000 | ORAL_TABLET | Freq: Every day | ORAL | 3 refills | Status: DC

## 2021-04-17 MED ORDER — LEVOTHYROXINE SODIUM 112 MCG PO TABS: 112.0000 ug | ORAL_TABLET | Freq: Every day | ORAL | 3 refills | Status: DC

## 2021-04-17 NOTE — Telephone Encounter (Signed)
Please inform pt her labs look great! Bad cholesterol went from 187 to 97 with medication.  Liver fx and thyroid fx  is normal range. I have refilled her thyroid medication- TAKE DAILY!!!.  Follow up August 2023 for chronic conditions.

## 2021-04-17 NOTE — Telephone Encounter (Signed)
Spoke with patient regarding results/recommendations.  

## 2021-04-22 ENCOUNTER — Telehealth: Payer: Self-pay

## 2021-04-22 NOTE — Telephone Encounter (Signed)
LVM for pt to CB to sched AWV.  

## 2021-10-07 ENCOUNTER — Telehealth: Payer: Self-pay

## 2021-10-07 NOTE — Telephone Encounter (Signed)
FYI: ? ?Scheduled pt for AWV for Katie Rojas on 10/10/21 at 2:15 (telephone) ?

## 2021-10-07 NOTE — Telephone Encounter (Signed)
Called pt to schedule AWV. Please schedule with health coach, Toria or Kane Kusek. ? ?

## 2021-10-07 NOTE — Telephone Encounter (Signed)
Noted  

## 2021-10-09 NOTE — Progress Notes (Signed)
Subjective:   Katie Rojas is a 61 y.o. female who presents for an Initial Medicare Annual Wellness Visit.  Review of Systems    I connected with  Katie Rojas on 10/10/21 by an audio only telemedicine application and verified that I am speaking with the correct person using two identifiers.   I discussed the limitations, risks, security and privacy concerns of performing an evaluation and management service by telephone and the availability of in person appointments. I also discussed with the patient that there may be a patient responsible charge related to this service. The patient expressed understanding and verbally consented to this telephonic visit.  Location of Patient: home Location of Provider:office  List any persons and their role that are participating in the visit with the patient.   Cardiac Risk Factors include: none Katie Rojas     Objective:    There were no vitals filed for this visit. There is no height or weight on file to calculate BMI.  Advanced Directives 02/03/2020  Does Patient Have a Medical Advance Directive? No    Current Medications (verified) Outpatient Encounter Medications as of 10/10/2021  Medication Sig   atorvastatin (LIPITOR) 20 MG tablet Take 1 tablet (20 mg total) by mouth daily.   buPROPion (WELLBUTRIN XL) 300 MG 24 hr tablet TAKE 1 TABLET(300 MG) BY MOUTH DAILY   Cholecalciferol (VITAMIN D3) 125 MCG (5000 UT) TABS Take 1 tablet (5,000 Units total) by mouth daily.   clonazePAM (KLONOPIN) 1 MG tablet Take 1 tablet (1 mg total) by mouth 3 (three) times daily as needed for anxiety. (Patient taking differently: Take 1 mg by mouth 2 (two) times daily.)   levothyroxine (SYNTHROID) 112 MCG tablet Take 1 tablet (112 mcg total) by mouth daily before breakfast. GENERIC OKAY   sertraline (ZOLOFT) 100 MG tablet TAKE 2 TABLETS BY MOUTH EVERY DAY   No facility-administered encounter medications on file as of 10/10/2021.     Allergies (verified) Relafen [nabumetone]   History: Past Medical History:  Diagnosis Date   Chicken pox    Depression with anxiety    Fracture of 5th metatarsal 2017   rigth ankle sprain and 5th meatarsal fx- managed with boot.    Hepatitis B 1979   History of colon polyps    Hyperthyroidism 2005   Nuclear rad tx (I131)-- on synthoid   Obsessive-compulsive disorder    Ophthalmic Graves disease 2005   Right shoulder pain 11/12/2017   Normal x-ray.  Mild spurring of AC joint of shoulder.   Past Surgical History:  Procedure Laterality Date   ABDOMINAL HYSTERECTOMY     BLEPHAROPLASTY Bilateral 2005   Secondary from proptosis from ocular Graves' disease.   ORBITAL OSTEOTOMY Bilateral    Ocular Graves' disease   WISDOM TOOTH EXTRACTION     Family History  Problem Relation Age of Onset   Schizophrenia Paternal Grandmother    Arthritis Paternal Grandmother    Drug abuse Brother    Alcohol abuse Brother    Learning disabilities Sister    COPD Mother    Hypertension Mother    Miscarriages / [place] Mother    Lung cancer Mother    Tremor Mother        bilateral hands   Early death Father    Hypertension Father    Pancreatitis Father    Depression Daughter    Alcohol abuse Maternal Grandmother    Hearing loss Maternal Grandmother    Alcoholism Maternal Grandfather  Hearing loss Paternal Grandfather    Stroke Paternal Grandfather    Depression Sister    Tremor Sister        left hand- oldest of her sisters   Alcohol abuse Sister    Tremor Nephew        hand; youngest sisters son   Social History   Socioeconomic History   Marital status: Married    Spouse name: Not on file   Number of children: 2   Years of education: 2 years of college   Highest education level: Not on file  Occupational History   Occupation: accountant    Comment: 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  Tobacco Use   Smoking status: Former    Types: Cigarettes    Quit date: 09/22/2003     Years since quitting: 18.0   Smokeless tobacco: Never  Vaping Use   Vaping Use: Never used  Substance and Sexual Activity   Alcohol use: Yes    Comment: 2-3 glasses wine/week   Drug use: Not Currently   Sexual activity: Not Currently  Other Topics Concern   Not on file  Social History Narrative   Marital status/children/pets: Married   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, retired   Engineer, materials:      -smoke alarm in the home:Yes     - wears seatbelt: Yes     - Feels safe in their relationships: Yes   Social Determinants of Radio broadcast assistant Strain: Low Risk    Difficulty of Paying Living Expenses: Not hard at all  Food Insecurity: No Food Insecurity   Worried About Charity fundraiser in the Last Year: Never true   [place] in the Last Year: Never true  Transportation Needs: No Transportation Needs   Lack of Transportation (Medical): No   Lack of Transportation (Non-Medical): No  Physical Activity: Sufficiently Active   Days of Exercise per Week: 7 days   Minutes of Exercise per Session: 60 min  Stress: No Stress Concern Present   Feeling of Stress : Not at all  Social Connections: Moderately Isolated   Frequency of Communication with Friends and Family: Three times a week   Frequency of Social Gatherings with Friends and Family: Never   Attends Religious Services: Never   Marine scientist or Organizations: No   Attends Music therapist: Never   Marital Status: Married    Tobacco Counseling Counseling given: Not Answered   Clinical Intake:  Pre-visit preparation completed: Yes        Diabetes: No  How often do you need to have someone help you when you read instructions, pamphlets, or other written materials from your doctor or pharmacy?: 1 - Never  Diabetic?no  Interpreter Needed?: No      Activities of Daily Living In your present state of health, do you have any difficulty performing the following  activities: 10/10/2021  Hearing? N  Vision? N  Difficulty concentrating or making decisions? N  Walking or climbing stairs? N  Dressing or bathing? N  Doing errands, shopping? N  Preparing Food and eating ? N  Using the Toilet? N  In the past six months, have you accidently leaked urine? N  Do you have problems with loss of bowel control? N  Managing your Medications? N  Managing your Finances? N  Housekeeping or managing your Housekeeping? N  Some recent data might be hidden    Patient Care Team: Howard Pouch A, DO as  PCP - General (Family Medicine) Pucilowski, Marchia Bond, MD (Inactive) as Consulting Physician (Psychiatry) Gastroenterology, Geryl Rankins, Eustace Quail, DO as Consulting Physician (Neurology)  Indicate any recent Medical Services you may have received from other than [facility] providers in the past year (date may be approximate).     Assessment:   This is a routine wellness examination for Katie Rojas.  Hearing/Vision screen No results found.  Dietary issues and exercise activities discussed: Current Exercise Habits: Structured exercise class, Type of exercise: walking, Time (Minutes): 60   Goals Addressed   None   Depression Screen PHQ 2/9 Scores 10/10/2021 04/16/2021 02/05/2021 02/02/2019 10/21/2018  PHQ - 2 Score 0 4 6 5 4   PHQ- 9 Score - 13 20 16 13     Fall Risk Fall Risk  10/10/2021 02/03/2020 09/06/2019  Falls in the past year? 0 1 1  Number falls in past yr: 0 1 1  Injury with Fall? 0 1 1  Risk for fall due to : History of fall(s) - -  Follow up Falls evaluation completed - -    FALL RISK PREVENTION PERTAINING TO THE HOME:  Any stairs in or around the home? Yes  If so, are there any without handrails? Yes  Home free of loose throw rugs in walkways, pet beds, electrical cords, etc? Yes  Adequate lighting in your home to reduce risk of falls? Yes   ASSISTIVE DEVICES UTILIZED TO PREVENT FALLS:  Life alert? No  Use of a cane, walker or w/c? No  Grab bars in the  bathroom? Yes  Shower chair or bench in shower? Yes  Elevated toilet seat or a handicapped toilet? Yes   TIMED UP AND GO:  Was the test performed?  n/a .  Length of time to ambulate 10 feet: n/a sec.     Cognitive Function:     6CIT Screen 10/10/2021  What Year? 0 points  What month? 0 points  What time? 0 points  Count back from 20 0 points  Months in reverse 0 points  Repeat phrase 0 points  Total Score 0    Immunizations Immunization History  Administered Date(s) Administered   Influenza,inj,Quad PF,6+ Mos 05/18/2018, 05/21/2019, 04/16/2021   Janssen (J&J) SARS-COV-2 Vaccination 10/15/2019   PFIZER(Purple Top)SARS-COV-2 Vaccination 05/27/2020, 01/22/2021   Tdap 02/02/2019   Zoster Recombinat (Shingrix) 02/02/2019    TDAP status: Up to date  Flu Vaccine status: Up to date  Pneumococcal vaccine status: Due, Education has been provided regarding the importance of this vaccine. Advised may receive this vaccine at local pharmacy or Health Dept. Aware to provide a copy of the vaccination record if obtained from local pharmacy or Health Dept. Verbalized acceptance and understanding.  Covid-19 vaccine status: Information provided on how to obtain vaccines.   Qualifies for Shingles Vaccine? Yes   Zostavax completed No   Shingrix Completed?: No.    Education has been provided regarding the importance of this vaccine. Patient has been advised to call insurance company to determine out of pocket expense if they have not yet received this vaccine. Advised may also receive vaccine at local pharmacy or Health Dept. Verbalized acceptance and understanding.  Screening Tests Health Maintenance  Topic Date Due   Zoster Vaccines- Shingrix (2 of 2) 03/30/2019   COVID-19 Vaccine (4 - Booster for Janssen series) 03/19/2021   MAMMOGRAM  04/19/2021   DEXA SCAN  04/19/2022   COLONOSCOPY (Pts 45-88yrs Insurance coverage will need to be confirmed)  08/05/2028   TETANUS/TDAP  02/01/2029  INFLUENZA VACCINE  Completed   Hepatitis C Screening  Completed   HIV Screening  Completed   HPV VACCINES  Aged Out    Health Maintenance  Health Maintenance Due  Topic Date Due   Zoster Vaccines- Shingrix (2 of 2) 03/30/2019   COVID-19 Vaccine (4 - Booster for Janssen series) 03/19/2021   MAMMOGRAM  04/19/2021    Colorectal cancer screening: Type of screening: Colonoscopy. Completed 08/05/2018. Repeat every 10 years  Mammogram status: Completed 04/20/2019. Repeat every year  Bone Density status: Completed 04/20/2019. Results reflect: Bone density results: OSTEOPENIA. Repeat every 3 years.  Lung Cancer Screening: (Low Dose CT Chest recommended if Age 41-80 years, 30 pack-year currently smoking OR have quit w/in 15years.) does not qualify.   Lung Cancer Screening Referral: n/a  Additional Screening:  Hepatitis C Screening: does qualify; Completed 02/02/2019  Vision Screening: Recommended annual ophthalmology exams for early detection of glaucoma and other disorders of the eye. Is the patient up to date with their annual eye exam?  No  Who is the provider or what is the name of the office in which the patient attends annual eye exams? N/A If pt is not established with a provider, would they like to be referred to a provider to establish care? No .   Dental Screening: Recommended annual dental exams for proper oral hygiene  Community Resource Referral / Chronic Care Management: CRR required this visit?  No   CCM required this visit?  No      Plan:     I have personally reviewed and noted the following in the patients chart:   Medical and social history Use of alcohol, tobacco or illicit drugs  Current medications and supplements including opioid prescriptions. Patient is not currently taking opioid prescriptions. Functional ability and status Nutritional status Physical activity Advanced directives List of other physicians Hospitalizations, surgeries, and ER  visits in previous 12 months Vitals Screenings to include cognitive, depression, and falls Referrals and appointments  In addition, I have reviewed and discussed with patient certain preventive protocols, quality metrics, and best practice recommendations. A written personalized care plan for preventive services as well as general preventive health recommendations were provided to patient.     Octaviano Glow, CMA   10/10/2021   Nurse Notes: Non-Face to Face or Face to Face 10 minute visit Encounter   Ms. Homen , Thank you for taking time to come for your Medicare Wellness Visit. I appreciate your ongoing commitment to your health goals. Please review the following plan we discussed and let me know if I can assist you in the future.   These are the goals we discussed:  Goals   None     This is a list of the screening recommended for you and due dates:  Health Maintenance  Topic Date Due   Zoster (Shingles) Vaccine (2 of 2) 03/30/2019   COVID-19 Vaccine (4 - Booster for Janssen series) 03/19/2021   Mammogram  04/19/2021   DEXA scan (bone density measurement)  04/19/2022   Colon Cancer Screening  08/05/2028   Tetanus Vaccine  02/01/2029   Flu Shot  Completed   Hepatitis C Screening: USPSTF Recommendation to screen - Ages 18-79 yo.  Completed   HIV Screening  Completed   HPV Vaccine  Aged Out

## 2021-10-09 NOTE — Patient Instructions (Signed)

## 2021-10-10 ENCOUNTER — Other Ambulatory Visit: Payer: Self-pay

## 2021-10-10 ENCOUNTER — Ambulatory Visit (INDEPENDENT_AMBULATORY_CARE_PROVIDER_SITE_OTHER): Payer: Medicare Other

## 2021-10-10 DIAGNOSIS — Z0000 Encounter for general adult medical examination without abnormal findings: Secondary | ICD-10-CM | POA: Diagnosis not present

## 2021-12-11 ENCOUNTER — Other Ambulatory Visit: Payer: Self-pay

## 2021-12-11 DIAGNOSIS — Z1231 Encounter for screening mammogram for malignant neoplasm of breast: Secondary | ICD-10-CM

## 2021-12-18 ENCOUNTER — Ambulatory Visit
Admission: RE | Admit: 2021-12-18 | Discharge: 2021-12-18 | Disposition: A | Discharge status: Home / Self Care | Payer: Medicare Other | Source: Ambulatory Visit | Attending: Family Medicine | Admitting: Family Medicine

## 2021-12-18 DIAGNOSIS — Z1231 Encounter for screening mammogram for malignant neoplasm of breast: Secondary | ICD-10-CM

## 2022-02-01 ENCOUNTER — Other Ambulatory Visit: Payer: Self-pay | Admitting: Family Medicine

## 2022-02-05 ENCOUNTER — Encounter: Payer: Medicare Other | Admitting: Family Medicine

## 2022-03-11 ENCOUNTER — Telehealth: Payer: Self-pay | Admitting: Family Medicine

## 2022-03-11 ENCOUNTER — Ambulatory Visit (HOSPITAL_BASED_OUTPATIENT_CLINIC_OR_DEPARTMENT_OTHER)
Admission: RE | Admit: 2022-03-11 | Discharge: 2022-03-11 | Disposition: A | Discharge status: Home / Self Care | Payer: Medicare Other | Source: Ambulatory Visit | Attending: Family Medicine | Admitting: Family Medicine

## 2022-03-11 ENCOUNTER — Ambulatory Visit (INDEPENDENT_AMBULATORY_CARE_PROVIDER_SITE_OTHER): Payer: Medicare Other | Admitting: Family Medicine

## 2022-03-11 ENCOUNTER — Encounter: Payer: Self-pay | Admitting: Family Medicine

## 2022-03-11 VITALS — BP 92/58 | HR 73 | Temp 98.0°F | Ht 64.5 in | Wt 165.0 lb

## 2022-03-11 DIAGNOSIS — Z79899 Other long term (current) drug therapy: Secondary | ICD-10-CM | POA: Diagnosis not present

## 2022-03-11 DIAGNOSIS — E559 Vitamin D deficiency, unspecified: Secondary | ICD-10-CM

## 2022-03-11 DIAGNOSIS — S99912A Unspecified injury of left ankle, initial encounter: Secondary | ICD-10-CM | POA: Insufficient documentation

## 2022-03-11 DIAGNOSIS — M8580 Other specified disorders of bone density and structure, unspecified site: Secondary | ICD-10-CM | POA: Diagnosis not present

## 2022-03-11 DIAGNOSIS — R251 Tremor, unspecified: Secondary | ICD-10-CM

## 2022-03-11 DIAGNOSIS — E034 Atrophy of thyroid (acquired): Secondary | ICD-10-CM | POA: Diagnosis not present

## 2022-03-11 DIAGNOSIS — N1831 Chronic kidney disease, stage 3a: Secondary | ICD-10-CM

## 2022-03-11 DIAGNOSIS — M8589 Other specified disorders of bone density and structure, multiple sites: Secondary | ICD-10-CM | POA: Insufficient documentation

## 2022-03-11 LAB — CBC
HCT: 39.5 % (ref 36.0–46.0)
Hemoglobin: 13.4 g/dL (ref 12.0–15.0)
MCHC: 34 g/dL (ref 30.0–36.0)
MCV: 92.9 fl (ref 78.0–100.0)
Platelets: 292 10*3/uL (ref 150.0–400.0)
RBC: 4.26 Mil/uL (ref 3.87–5.11)
RDW: 12.8 % (ref 11.5–15.5)
WBC: 4.6 10*3/uL (ref 4.0–10.5)

## 2022-03-11 LAB — T4, FREE: Free T4: 0.94 ng/dL (ref 0.60–1.60)

## 2022-03-11 LAB — LIPID PANEL
Cholesterol: 144 mg/dL (ref 0–200)
HDL: 59.1 mg/dL (ref >39.00)
LDL Cholesterol: 59 mg/dL (ref 0–99)
NonHDL: 84.99
Total CHOL/HDL Ratio: 2
Triglycerides: 131 mg/dL (ref 0.0–149.0)
VLDL: 26.2 mg/dL (ref 0.0–40.0)

## 2022-03-11 LAB — COMPREHENSIVE METABOLIC PANEL
ALT: 26 U/L (ref 0–35)
AST: 23 U/L (ref 0–37)
Albumin: 4.3 g/dL (ref 3.5–5.2)
Alkaline Phosphatase: 91 U/L (ref 39–117)
BUN: 14 mg/dL (ref 6–23)
CO2: 30 mEq/L (ref 19–32)
Calcium: 9.7 mg/dL (ref 8.4–10.5)
Chloride: 102 mEq/L (ref 96–112)
Creatinine, Ser: 0.88 mg/dL (ref 0.40–1.20)
GFR: 70.07 mL/min (ref >60.00)
Glucose, Bld: 89 mg/dL (ref 70–99)
Potassium: 4.9 mEq/L (ref 3.5–5.1)
Sodium: 141 mEq/L (ref 135–145)
Total Bilirubin: 0.4 mg/dL (ref 0.2–1.2)
Total Protein: 6.6 g/dL (ref 6.0–8.3)

## 2022-03-11 LAB — VITAMIN D 25 HYDROXY (VIT D DEFICIENCY, FRACTURES): VITD: 34.54 ng/mL (ref 30.00–100.00)

## 2022-03-11 LAB — HEMOGLOBIN A1C: Hgb A1c MFr Bld: 5.8 % (ref 4.6–6.5)

## 2022-03-11 LAB — TSH: TSH: 0.05 u[IU]/mL — ABNORMAL LOW (ref 0.35–5.50)

## 2022-03-11 MED ORDER — VITAMIN D3 125 MCG (5000 UT) PO TABS: 1.0000 | ORAL_TABLET | Freq: Every day | ORAL | 3 refills | Status: AC

## 2022-03-11 MED ORDER — PRIMIDONE 50 MG PO TABS: 50.0000 mg | ORAL_TABLET | Freq: Every day | ORAL | 1 refills | Status: DC

## 2022-03-11 MED ORDER — ATORVASTATIN CALCIUM 20 MG PO TABS: 20.0000 mg | ORAL_TABLET | Freq: Every day | ORAL | 3 refills | Status: AC

## 2022-03-11 MED ORDER — LEVOTHYROXINE SODIUM 112 MCG PO TABS: ORAL_TABLET | ORAL | 3 refills | Status: AC

## 2022-03-11 NOTE — Telephone Encounter (Signed)
Please call patient Liver and kidney functions are normal. Cholesterol panel looks great. Vitamin D levels are now normal.  Continue the supplement. Blood cell counts and electrolytes are normal. A1c/diabetes screen is normal.  Thyroid is a little oversupplemented now.  Continue current dose as is, with the only change is take 1 tab 6 days a week and a half a tab 1 day a week.

## 2022-03-11 NOTE — Telephone Encounter (Signed)
LVM for pt to CB regarding results.  

## 2022-03-11 NOTE — Telephone Encounter (Signed)
Please inform patient Her ankle x-ray did show a cortical avulsion fracture at lateral margin of the lateral malleolus LEFT ankle.  It is very small.  During her office visit visit she stated that she had a ankle brace.  I would encourage her to wear the ankle brace for 2 weeks and keep pressure off of the ankle.  Then slowly ease into normal activity.  If needing an ankle brace, please set her up for nurse visit so that we can fit her for one Thanks

## 2022-03-11 NOTE — Patient Instructions (Addendum)
Return in about 24 weeks (around 08/26/2022) for Routine chronic condition follow-up.        Great to see you today.  I have refilled the medication(s) we provide.   If labs were collected, we will inform you of lab results once received either by echart message or telephone call.   - echart message- for normal results that have been seen by the patient already.   - telephone call: abnormal results or if patient has not viewed results in their echart.  

## 2022-03-11 NOTE — Progress Notes (Signed)
Patient ID: Katie Rojas, female  DOB: 1960-11-04, 61 y.o.   MRN: [id number] Patient Care Team    Relationship Specialty Notifications Start End  Ma Hillock, DO PCP - General Family Medicine  10/21/18   Stephanie Acre, MD (Inactive) Consulting Physician Psychiatry  10/21/18   Tat, Eustace Quail, DO Consulting Physician Neurology  09/06/19   Ronnette Juniper, MD Consulting Physician Gastroenterology  03/12/22     Chief Complaint  Patient presents with   Hypothyroidism    [facility]; pt is not fasting     Subjective: Katie Rojas is a 61 y.o.  Female  present for [facility] All past medical history, surgical history, allergies, family history, immunizations, medications and social history were updated in the electronic medical record today. All recent labs, ED visits and hospitalizations within the last year were reviewed.  Hypothyroidism, unspecified type She reported compliance with levo 112 mcg qd.   HLD: Tolerating atorvastatin 20 mg daily.  Vitamin D deficiency/Stage 3b chronic kidney disease ([place]) Reports she is taking her vitamin D supplement daily now.  Tremor: Patient has been prescribed primidone 50 mg nightly by neurology.  She feels this is working well and would like to continue.     10/10/2021    2:01 PM 04/16/2021    1:17 PM 02/05/2021   11:15 AM 02/02/2019    9:11 AM 10/21/2018    9:14 AM  Depression screen PHQ 2/9  Decreased Interest 0 _0 Down, Depressed, Hopeless 0 _1 PHQ - 2 Score 0 _2 Altered sleeping  _3 Tired, decreased energy  _4 Change in appetite  _5 Feeling bad or failure about yourself   _6 Trouble concentrating  _7 Moving slowly or fidgety/restless  1 2 0 0  Suicidal thoughts  _8 0  PHQ-9 Score  _9 Difficult doing work/chores    Very difficult Somewhat difficult      04/16/2021    1:18 PM 02/05/2021   11:15 AM 02/02/2019    9:10 AM 10/21/2018    9:14 AM  GAD 7 : Generalized Anxiety Score   Nervous, Anxious, on Edge _10 Control/stop worrying _11 Worry too much - different things _12 Trouble relaxing _13 Restless 0 1 1 0  Easily annoyed or irritable _14 Afraid - awful might happen _15 Total GAD 7 Score _16 Anxiety Difficulty   Very difficult Somewhat difficult     Immunization History  Administered Date(s) Administered   Influenza,inj,Quad PF,6+ Mos 05/18/2018, 05/21/2019, 04/16/2021   Janssen (J&J) SARS-COV-2 Vaccination 10/15/2019   Moderna Covid-19 Vaccine Bivalent Booster 75yr & up 05/22/2021   PFIZER(Purple Top)SARS-COV-2 Vaccination 05/27/2020, 01/22/2021   Tdap 02/02/2019   Zoster Recombinat (Shingrix) 02/02/2019    Past Medical History:  Diagnosis Date   Chicken pox    Depression with anxiety    Fracture of 5th metatarsal 2017   rigth ankle sprain and 5th meatarsal fx- managed with boot.    Hepatitis B 1979   History of colon polyps    Hyperthyroidism 2005   Nuclear rad tx (I131)-- on synthoid   Obsessive-compulsive disorder    Ophthalmic Graves disease 2005  Right shoulder pain 11/12/2017   Normal x-ray.  Mild spurring of AC joint of shoulder.   Allergies  Allergen Reactions   Relafen [Nabumetone] Other (See Comments)    Stomach upset   Past Surgical History:  Procedure Laterality Date   ABDOMINAL HYSTERECTOMY     BLEPHAROPLASTY Bilateral 2005   Secondary from proptosis from ocular Graves' disease.   ORBITAL OSTEOTOMY Bilateral    Ocular Graves' disease   WISDOM TOOTH EXTRACTION     Family History  Problem Relation Age of Onset   Schizophrenia Paternal Grandmother    Arthritis Paternal Grandmother    Drug abuse Brother    Alcohol abuse Brother    Learning disabilities Sister    COPD Mother    Hypertension Mother    Miscarriages / Stillbirths Mother    Lung cancer Mother    Tremor Mother        bilateral hands   Early death Father    Hypertension Father    Pancreatitis Father    Depression  Daughter    Alcohol abuse Maternal Grandmother    Hearing loss Maternal Grandmother    Alcoholism Maternal Grandfather    Hearing loss Paternal Grandfather    Stroke Paternal Grandfather    Depression Sister    Tremor Sister        left hand- oldest of her sisters   Alcohol abuse Sister    Tremor Nephew        hand; youngest sisters son   Social History   Social History Narrative   Marital status/children/pets: Married   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 If you have any questions, ask your nurse or doctor.          STOP taking these medications    buPROPion 300 MG 24 hr tablet Commonly known as: WELLBUTRIN XL Stopped by: Howard Pouch, DO   sertraline 100 MG tablet Commonly known as: ZOLOFT Stopped by: Howard Pouch, DO       TAKE these medications    atorvastatin 20 MG tablet Commonly known as: LIPITOR Take 1 tablet (20 mg total) by mouth daily.   clonazePAM 1 MG tablet Commonly known as: KLONOPIN Take 1 tablet (1 mg total) by mouth 3 (three) times daily as needed for anxiety. What changed: when to take this   levothyroxine 112 MCG tablet Commonly known as: SYNTHROID 1 tab po 6d a week and 1/2 tab on Sunday What changed:  how much to take how to take this when to take this additional instructions Changed by: Howard Pouch, DO   primidone 50 MG tablet Commonly known as: MYSOLINE Take 1 tablet (50 mg total) by mouth at bedtime. Started by: Howard Pouch, DO   venlafaxine XR 150 MG 24 hr capsule Commonly known as: EFFEXOR-XR Take 150 mg by mouth daily.   Vitamin D3 125 MCG (5000 UT) Tabs Take 1 tablet (5,000 Units total) by  mouth daily.   Vraylar 1.5 MG capsule Generic drug: cariprazine Take 1.5 mg by mouth daily.  All past medical history, surgical history, allergies, family history, immunizations andmedications were updated in the EMR today and reviewed under the history and medication portions of their EMR.     Recent Results (from the past 2160 hour(s))  CBC     Status: None   Collection Time: 03/11/22 10:07 AM  Result Value Ref Range   WBC 4.6 4.0 - 10.5 K/uL   RBC 4.26 3.87 - 5.11 Mil/uL   Platelets 292.0 150.0 - 400.0 K/uL   Hemoglobin 13.4 12.0 - 15.0 g/dL   HCT 39.5 36.0 - 46.0 %   MCV 92.9 78.0 - 100.0 fl   MCHC 34.0 30.0 - 36.0 g/dL   RDW 12.8 11.5 - 15.5 %  Comp Met (CMET)     Status: None   Collection Time: 03/11/22 10:07 AM  Result Value Ref Range   Sodium 141 135 - 145 mEq/L   Potassium 4.9 3.5 - 5.1 mEq/L   Chloride 102 96 - 112 mEq/L   CO2 30 19 - 32 mEq/L   Glucose, Bld 89 70 - 99 mg/dL   BUN 14 6 - 23 mg/dL   Creatinine, Ser 0.88 0.40 - 1.20 mg/dL   Total Bilirubin 0.4 0.2 - 1.2 mg/dL   Alkaline Phosphatase 91 39 - 117 U/L   AST 23 0 - 37 U/L   ALT 26 0 - 35 U/L   Total Protein 6.6 6.0 - 8.3 g/dL   Albumin 4.3 3.5 - 5.2 g/dL   GFR 70.07 >60.00 mL/min    Comment: Calculated using the CKD-EPI Creatinine Equation (2021)   Calcium 9.7 8.4 - 10.5 mg/dL  TSH     Status: Abnormal   Collection Time: 03/11/22 10:07 AM  Result Value Ref Range   TSH 0.05 (L) 0.35 - 5.50 uIU/mL  T4, free     Status: None   Collection Time: 03/11/22 10:07 AM  Result Value Ref Range   Free T4 0.94 0.60 - 1.60 ng/dL    Comment: Specimens from patients who are undergoing biotin therapy and /or ingesting biotin supplements may contain high levels of biotin.  The higher biotin concentration in these specimens interferes with this Free T4 assay.  Specimens that contain high levels  of biotin may cause false high results for this Free T4 assay.  Please interpret results in light of the total  clinical presentation of the patient.    Hemoglobin A1c     Status: None   Collection Time: 03/11/22 10:07 AM  Result Value Ref Range   Hgb A1c MFr Bld 5.8 4.6 - 6.5 %    Comment: Glycemic Control Guidelines for People with Diabetes:Non Diabetic:  <6%Goal of Therapy: <7%Additional Action Suggested:  >8%   Lipid panel     Status: None   Collection Time: 03/11/22 10:07 AM  Result Value Ref Range   Cholesterol 144 0 - 200 mg/dL    Comment: ATP III Classification       Desirable:  < 200 mg/dL               Borderline High:  200 - 239 mg/dL          High:  > = 240 mg/dL   Triglycerides 131.0 0.0 - 149.0 mg/dL    Comment: Normal:  <150 mg/dLBorderline High:  150 - 199 mg/dL   HDL 59.10 >39.00 mg/dL   VLDL 26.2 0.0 - 40.0 mg/dL   LDL Cholesterol 59 0 - 99 mg/dL   Total CHOL/HDL Ratio 2  Comment:                Men          Women1/2 Average Risk     3.4          3.3Average Risk          5.0          4.42X Average Risk          9.6          7.13X Average Risk          15.0          11.0                       NonHDL 84.99     Comment: NOTE:  Non-HDL goal should be 30 mg/dL higher than patient's LDL goal (i.e. LDL goal of < 70 mg/dL, would have non-HDL goal of < 100 mg/dL)  Vitamin D (25 hydroxy)     Status: None   Collection Time: 03/11/22 10:07 AM  Result Value Ref Range   VITD 34.54 30.00 - 100.00 ng/mL    MM 3D SCREEN BREAST BILATERAL  Result Date: 12/19/2021 CLINICAL DATA:  Screening. EXAM: DIGITAL SCREENING BILATERAL MAMMOGRAM WITH TOMOSYNTHESIS AND CAD TECHNIQUE: Bilateral screening digital craniocaudal and mediolateral oblique mammograms were obtained. Bilateral screening digital breast tomosynthesis was performed. The images were evaluated with computer-aided detection. COMPARISON:  Previous exam(s). ACR Breast Density Category a: The breast tissue is almost entirely fatty. FINDINGS: There are no findings suspicious for malignancy. IMPRESSION: No mammographic evidence of malignancy. A  result letter of this screening mammogram will be mailed directly to the patient. RECOMMENDATION: Screening mammogram in one year. (Code:SM-B-01Y) BI-RADS CATEGORY  1: Negative. Electronically Signed   By: Ileana Roup M.D.   On: 12/19/2021 09:04     ROS 14 pt review of systems performed and negative (unless mentioned in an HPI)  Objective: BP (!) 92/58   Pulse 73   Temp 98 F (36.7 C) (Oral)   Ht 5' 4.5" (1.638 m)   Wt 165 lb (74.8 kg)   SpO2 99%   BMI 27.88 kg/m  Physical Exam Vitals and nursing note reviewed.  Constitutional:      General: She is not in acute distress.    Appearance: Normal appearance. She is not ill-appearing, toxic-appearing or diaphoretic.  HENT:     Head: Normocephalic and atraumatic.  Eyes:     General: No scleral icterus.       Right eye: No discharge.        Left eye: No discharge.     Extraocular Movements: Extraocular movements intact.     Conjunctiva/sclera: Conjunctivae normal.     Pupils: Pupils are equal, round, and reactive to light.  Cardiovascular:     Rate and Rhythm: Normal rate and regular rhythm.  Pulmonary:     Effort: Pulmonary effort is normal. No respiratory distress.     Breath sounds: Normal breath sounds. No wheezing, rhonchi or rales.  Musculoskeletal:        General: Swelling and tenderness present.     Cervical back: Neck supple. No tenderness.     Right lower leg: No edema.     Left lower leg: No edema.     Comments: Left lateral ankle mild swelling and TTP lateral malleolus.   Lymphadenopathy:     Cervical: No cervical adenopathy.  Skin:    General: Skin is warm and  dry.     Coloration: Skin is not jaundiced or pale.     Findings: No erythema or rash.  Neurological:     Mental Status: She is alert and oriented to person, place, and time. Mental status is at baseline.     Motor: No weakness.     Gait: Gait normal.  Psychiatric:        Mood and Affect: Mood normal.        Behavior: Behavior normal.        Thought  Content: Thought content normal.        Judgment: Judgment normal.       No results found.  Assessment/plan: Katie Rojas is a 61 y.o. female present for [facility] Vitamin D deficiency/osteopenia/disorder of bone density/Stage 3a chronic kidney disease ([place]) - DG Bone Density; Future - Vitamin D (25 hydroxy) - Cholecalciferol (VITAMIN D3) 125 MCG (5000 UT) TABS; Take 1 tablet (5,000 Units total) by mouth daily.  Dispense: 90 tablet; Refill: 3  Hypothyroidism due to acquired atrophy of thyroid Continue levothyroxine 112 mcg 6 days a week and a half tab on Sunday.  Once laboratory results are received we will call in refills and appropriate dose for her. - CBC - Comp Met (CMET) - TSH - T4, free - Hemoglobin A1c - Lipid panel  Encounter for long-term current use of medication - Hemoglobin A1c - Vitamin D (25 hydroxy)   Injury of left ankle, initial encounter New problem for patient..  Less than a week old injury.  Reported pain left lateral ankle.  Mildly swollen today and tender we will obtain x-ray and patient will be called with results and further plan. - DG Ankle Complete Left  Tremor Agreed to take over primidone prescription. Primidone 50 mg nightly prescribed   Return in about 24 weeks (around 08/26/2022) for Routine chronic condition follow-up. Orders Placed This Encounter  Procedures   DG Bone Density   DG Ankle Complete Left   CBC   Comp Met (CMET)   TSH   T4, free   Hemoglobin A1c   Lipid panel   Vitamin D (25 hydroxy)   Meds ordered this encounter  Medications   atorvastatin (LIPITOR) 20 MG tablet    Sig: Take 1 tablet (20 mg total) by mouth daily.    Dispense:  90 tablet    Refill:  3   Cholecalciferol (VITAMIN D3) 125 MCG (5000 UT) TABS    Sig: Take 1 tablet (5,000 Units total) by mouth daily.    Dispense:  90 tablet    Refill:  3   primidone (MYSOLINE) 50 MG tablet    Sig: Take 1 tablet (50 mg total) by mouth at bedtime.    Dispense:  90  tablet    Refill:  1   Referral Orders  No referral(s) requested today    50 minutes spent providing patient care including covering multiple chronic conditions, taking over conditions typically treated by neurology and covering new ankle injury.  Electronically signed by: Howard Pouch, DO [place]

## 2022-03-12 ENCOUNTER — Telehealth: Payer: Self-pay

## 2022-03-12 NOTE — Telephone Encounter (Signed)
Patient was seen yesterday.  Patient had xray and has broken her foot.  Patient needs boot.  She will need prescription to take to medical supply store.  There is one in [place], unsure if they have boot in stock.   Please follow up with patient, they are needing something as soon as possible please.  [phone]

## 2022-03-12 NOTE — Telephone Encounter (Signed)
Please advise on pain meds. 

## 2022-03-12 NOTE — Telephone Encounter (Signed)
Patient came into office to check on prescription for pain medicine for her foot. She became inpatient and mentioned she was heading back home an hour away and she would be looking for a new physician.

## 2022-03-12 NOTE — Telephone Encounter (Signed)
Patient returning call about lab results.  Please call patient back when available.  

## 2022-03-12 NOTE — Telephone Encounter (Signed)
I recommended an ankle brace, not a boot.  We never discussed pain meds during her visit. I do not recommend prescribed pain meds for her fracture, it is very small.     - OTC nsaids recommended   If she is unhappy with her care, we can remove her from my panel. Her expectation to walk in to the office without an appt and expect immediate response is not appropriate or realistic.

## 2022-03-12 NOTE — Telephone Encounter (Signed)
Awaiting on PCP note re: meds

## 2022-03-12 NOTE — Telephone Encounter (Signed)
Patient was seen yesterday.  Patient had xray and has broken her foot.  Patient needs boot.  She will need prescription to take to medical supply store.  There is one in [place], unsure if they have boot in stock.   Please follow up with patient, they are needing something as soon as possible please.  [phone] 

## 2022-03-12 NOTE — Telephone Encounter (Signed)
Pt stated that she does not want [place] now that she is back home an hour away. Pt states she will call ins company to find DME stores [place] and [place]. Pt is aware of pain meds recommendations. Pt states she apologies for the misunderstanding and will like to keep provider as her primary.   Please advise if ok to proceed with DME order.

## 2022-03-12 NOTE — Telephone Encounter (Signed)
Spoke with pt regarding labs and instructions.   

## 2022-03-12 NOTE — Telephone Encounter (Signed)
Please use other encounter °

## 2022-03-12 NOTE — Telephone Encounter (Signed)
Patient requesting pain meds now.  She forgot to ask on previous call. Please advise

## 2022-03-14 ENCOUNTER — Encounter: Payer: Self-pay | Admitting: Family Medicine

## 2022-08-11 ENCOUNTER — Other Ambulatory Visit: Payer: Self-pay

## 2022-08-11 MED ORDER — PRIMIDONE 50 MG PO TABS: 50.0000 mg | ORAL_TABLET | Freq: Every day | ORAL | 0 refills | Status: AC

## 2022-09-02 ENCOUNTER — Encounter: Payer: Self-pay | Admitting: Family Medicine

## 2022-09-04 ENCOUNTER — Telehealth: Payer: Self-pay

## 2022-09-05 ENCOUNTER — Telehealth: Payer: Self-pay | Admitting: Family Medicine

## 2022-09-05 ENCOUNTER — Ambulatory Visit
Admission: RE | Admit: 2022-09-05 | Discharge: 2022-09-05 | Disposition: A | Discharge status: Home / Self Care | Payer: Medicare Other | Source: Ambulatory Visit | Attending: Family Medicine | Admitting: Family Medicine

## 2022-09-05 DIAGNOSIS — M810 Age-related osteoporosis without current pathological fracture: Secondary | ICD-10-CM

## 2022-09-05 DIAGNOSIS — E559 Vitamin D deficiency, unspecified: Secondary | ICD-10-CM

## 2022-09-05 DIAGNOSIS — M8580 Other specified disorders of bone density and structure, unspecified site: Secondary | ICD-10-CM

## 2022-09-05 DIAGNOSIS — M8589 Other specified disorders of bone density and structure, multiple sites: Secondary | ICD-10-CM

## 2022-09-05 DIAGNOSIS — M8000XA Age-related osteoporosis with current pathological fracture, unspecified site, initial encounter for fracture: Secondary | ICD-10-CM | POA: Insufficient documentation

## 2022-09-05 NOTE — Telephone Encounter (Signed)
Please inform patient her bone density results are as follows: There is decreased bone density since her last test in 2020.  Her left hip area is now in the osteoporosis range.  The density in her lower spine has also decreased. There is a medication that can be started to help slow down decrease in bone density.  Medication is called Fosamax and is taken once weekly.  If she would like to start this medication I will call it in for her today.

## 2022-09-05 NOTE — Telephone Encounter (Signed)
error 

## 2022-09-05 NOTE — Telephone Encounter (Signed)
Spoke with patient regarding results/recommendations. Pt agrees to starting medication

## 2022-09-10 ENCOUNTER — Telehealth: Payer: Self-pay | Admitting: Family Medicine

## 2022-09-10 NOTE — Telephone Encounter (Signed)
Pt never received the medication for fosamax. She however is looking for an alternative based on side effects she read.

## 2022-09-11 MED ORDER — ALENDRONATE SODIUM 70 MG PO TABS: 70.0000 mg | ORAL_TABLET | ORAL | 11 refills | Status: AC

## 2022-09-11 NOTE — Telephone Encounter (Signed)
If she still wants to try the Fosamax we can call that in for her it is once weekly. It is the first-line of treatment for this condition and insurance would require start of this medication initially before approving anything else for her.

## 2022-09-11 NOTE — Telephone Encounter (Signed)
Spoke with patient regarding results/recommendations. Pt okay to start medication as suggested

## 2022-09-11 NOTE — Addendum Note (Signed)
Addended by: Howard Pouch A on: 09/11/2022 11:18 AM   Modules accepted: Orders

## 2022-10-15 ENCOUNTER — Ambulatory Visit (INDEPENDENT_AMBULATORY_CARE_PROVIDER_SITE_OTHER): Payer: Medicare Other

## 2022-10-15 VITALS — Wt 157.0 lb

## 2022-10-15 DIAGNOSIS — Z0000 Encounter for general adult medical examination without abnormal findings: Secondary | ICD-10-CM

## 2022-10-15 NOTE — Progress Notes (Signed)
I connected with  Katie Rojas on 10/15/22 by a audio enabled telemedicine application and verified that I am speaking with the correct person using two identifiers.  Patient Location: Home  Provider Location: Home Office  I discussed the limitations of evaluation and management by telemedicine. The patient expressed understanding and agreed to proceed.   Subjective:   Katie Rojas is a 62 y.o. female who presents for Medicare Annual (Subsequent) preventive examination.  Review of Systems           Objective:    Today's Vitals   10/15/22 1154  Weight: 157 lb (71.2 kg)   Body mass index is 26.53 kg/m.     10/15/2022   12:01 PM 02/03/2020    9:31 AM  Advanced Directives  Does Patient Have a Medical Advance Directive? No No  Would patient like information on creating a medical advance directive? No - Patient declined     Current Medications (verified) Outpatient Encounter Medications as of 10/15/2022  Medication Sig   alendronate (FOSAMAX) 70 MG tablet Take 1 tablet (70 mg total) by mouth every 7 (seven) days. Take with a full glass of water on an empty stomach.   atorvastatin (LIPITOR) 20 MG tablet Take 1 tablet (20 mg total) by mouth daily.   Cholecalciferol (VITAMIN D3) 125 MCG (5000 UT) TABS Take 1 tablet (5,000 Units total) by mouth daily.   levothyroxine (SYNTHROID) 112 MCG tablet 1 tab po 6d a week and 1/2 tab on Sunday   primidone (MYSOLINE) 50 MG tablet Take 1 tablet (50 mg total) by mouth at bedtime.   venlafaxine XR (EFFEXOR-XR) 150 MG 24 hr capsule Take 150 mg by mouth daily.   clonazePAM (KLONOPIN) 1 MG tablet Take 1 tablet (1 mg total) by mouth 3 (three) times daily as needed for anxiety. (Patient taking differently: Take 1 mg by mouth 2 (two) times daily.)   VRAYLAR 1.5 MG capsule Take 1.5 mg by mouth daily. (Patient not taking: Reported on 10/15/2022)   No facility-administered encounter medications on file as of 10/15/2022.    Allergies  (verified) Relafen [nabumetone]   History: Past Medical History:  Diagnosis Date   Chicken pox    Depression with anxiety    Fracture of 5th metatarsal 2017   rigth ankle sprain and 5th meatarsal fx- managed with boot.    Gait instability 08/12/2019   Hepatitis B 1979   History of colon polyps    Hyperthyroidism 2005   Nuclear rad tx (I131)-- on synthoid   Obsessive-compulsive disorder    Ophthalmic Graves disease 2005   Right shoulder pain 11/12/2017   Normal x-ray.  Mild spurring of AC joint of shoulder.   Tibial plateau fracture, left 08/31/2019    MRI left knee - 08/19/2019- acute nondisplaced fracture of the posterolateral tibial plateau, acute nondisplaced fracture of the fibular head, complex flap tear of the medial meniscus, tear of the lateral meniscus, grade 1 sprain of the medial collateral ligament, tricompartmental chondrosis most notable in the patellar cartilage, moderate large knee joint effusion with synovitis, moderate compl   Past Surgical History:  Procedure Laterality Date   ABDOMINAL HYSTERECTOMY     BLEPHAROPLASTY Bilateral 2005   Secondary from proptosis from ocular Graves' disease.   ORBITAL OSTEOTOMY Bilateral    Ocular Graves' disease   WISDOM TOOTH EXTRACTION     Family History  Problem Relation Age of Onset   Schizophrenia Paternal Grandmother    Arthritis Paternal Grandmother    Drug abuse  Brother    Alcohol abuse Brother    Learning disabilities Sister    COPD Mother    Hypertension Mother    Miscarriages / Stillbirths Mother    Lung cancer Mother    Tremor Mother        bilateral hands   Early death Father    Hypertension Father    Pancreatitis Father    Depression Daughter    Alcohol abuse Maternal Grandmother    Hearing loss Maternal Grandmother    Alcoholism Maternal Grandfather    Hearing loss Paternal Grandfather    Stroke Paternal Grandfather    Depression Sister    Tremor Sister        left hand- oldest of her sisters    Alcohol abuse Sister    Tremor Nephew        hand; youngest sisters son   Social History   Socioeconomic History   Marital status: Married    Spouse name: Not on file   Number of children: 2   Years of education: 2 years of college   Highest education level: Not on file  Occupational History   Occupation: accountant    Comment: 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  Tobacco Use   Smoking status: Former    Types: Cigarettes    Quit date: 09/22/2003    Years since quitting: 19.0   Smokeless tobacco: Never  Vaping Use   Vaping Use: Never used  Substance and Sexual Activity   Alcohol use: Yes    Comment: 2-3 glasses wine/week   Drug use: Not Currently   Sexual activity: Not Currently  Other Topics Concern   Not on file  Social History Narrative   Marital status/children/pets: Married   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, retired   Engineer, materials:      -smoke alarm in the home:Yes     - wears seatbelt: Yes     - Feels safe in their relationships: Yes   Social Determinants of Health   Financial Resource Strain: Low Risk  (10/15/2022)   Overall Financial Resource Strain (CARDIA)    Difficulty of Paying Living Expenses: Not hard at all  Food Insecurity: No Food Insecurity (10/15/2022)   Hunger Vital Sign    Worried About Running Out of Food in the Last Year: Never true    [place] in the Last Year: Never true  Transportation Needs: No Transportation Needs (10/15/2022)   PRAPARE - Hydrologist (Medical): No    Lack of Transportation (Non-Medical): No  Physical Activity: Insufficiently Active (10/15/2022)   Exercise Vital Sign    Days of Exercise per Week: 3 days    Minutes of Exercise per Session: 20 min  Stress: No Stress Concern Present (10/15/2022)   [place]    Feeling of Stress : Only a little  Social Connections: Moderately Integrated (10/15/2022)   Social Connection  and Isolation Panel [NHANES]    Frequency of Communication with Friends and Family: More than three times a week    Frequency of Social Gatherings with Friends and Family: Once a week    Attends Religious Services: 1 to 4 times per year    Active Member of [employer] or Organizations: No    Attends Archivist Meetings: Never    Marital Status: Married    Tobacco Counseling Counseling given: Not Answered   Clinical Intake:  Pre-visit preparation completed: Yes  Pain : No/denies  pain     BMI - recorded: 26.53 Nutritional Status: BMI 25 -29 Overweight Nutritional Risks: None Diabetes: No  How often do you need to have someone help you when you read instructions, pamphlets, or other written materials from your doctor or pharmacy?: 1 - Never  Diabetic?no  Interpreter Needed?: No  Information entered by :: Charlott Rakes, LPN   Activities of Daily Living    10/15/2022   11:31 AM  In your present state of health, do you have any difficulty performing the following activities:  Hearing? 0  Vision? 0  Difficulty concentrating or making decisions? 0  Walking or climbing stairs? 0  Dressing or bathing? 0  Doing errands, shopping? 0  Preparing Food and eating ? N  Using the Toilet? N  In the past six months, have you accidently leaked urine? Y  Comment at times  Do you have problems with loss of bowel control? N  Managing your Medications? N  Managing your Finances? N  Housekeeping or managing your Housekeeping? N    Patient Care Team: Ma Hillock, DO as PCP - General (Family Medicine) Pucilowski, Marchia Bond, MD (Inactive) as Consulting Physician (Psychiatry) Tat, Eustace Quail, DO as Consulting Physician (Neurology) Ronnette Juniper, MD as Consulting Physician (Gastroenterology)  Indicate any recent Medical Services you may have received from other than [facility] providers in the past year (date may be approximate).     Assessment:   This is a routine wellness  examination for Katie Rojas.  Hearing/Vision screen Hearing Screening - Comments:: Pt denies any hearing issues  Vision Screening - Comments:: Pt follows up with provider   Dietary issues and exercise activities discussed: Current Exercise Habits: Home exercise routine, Type of exercise: walking, Time (Minutes): 20, Frequency (Times/Week): 3, Weekly Exercise (Minutes/Week): 60   Goals Addressed             This Visit's Progress    Patient Stated       Lose a few lbs        Depression Screen    10/15/2022   11:59 AM 10/10/2021    2:01 PM 04/16/2021    1:17 PM 02/05/2021   11:15 AM 02/02/2019    9:11 AM 10/21/2018    9:14 AM  PHQ 2/9 Scores  PHQ - 2 Score 1 0 '4 6 5 4  '$ PHQ- 9 Score   '13 20 16 13    '$ Fall Risk    10/15/2022   11:31 AM 10/10/2021    2:09 PM 02/03/2020    9:31 AM 09/06/2019    9:37 AM  Fall Risk   Falls in the past year? 1 0 1 1  Number falls in past yr: 0 0 1 1  Injury with Fall? 0 0 1 1  Risk for fall due to : Impaired vision History of fall(s)    Follow up Falls prevention discussed Falls evaluation completed      FALL RISK PREVENTION PERTAINING TO THE HOME:  Any stairs in or around the home? Yes  If so, are there any without handrails? No  Home free of loose throw rugs in walkways, pet beds, electrical cords, etc? Yes  Adequate lighting in your home to reduce risk of falls? Yes   ASSISTIVE DEVICES UTILIZED TO PREVENT FALLS:  Life alert? No  Use of a cane, walker or w/c? No  Grab bars in the bathroom? Yes  Shower chair or bench in shower? No  Elevated toilet seat or a handicapped toilet? No  TIMED UP AND GO:  Was the test performed? No .   Cognitive Function:        10/15/2022   12:03 PM 10/10/2021    2:04 PM  6CIT Screen  What Year? 0 points 0 points  What month? 0 points 0 points  What time? 0 points 0 points  Count back from 20 0 points 0 points  Months in reverse 0 points 0 points  Repeat phrase 0 points 0 points  Total Score 0 points 0  points    Immunizations Immunization History  Administered Date(s) Administered   Influenza,inj,Quad PF,6+ Mos 05/18/2018, 05/21/2019, 04/16/2021   Influenza-Unspecified 05/02/2022   Janssen (J&J) SARS-COV-2 Vaccination 10/15/2019   Moderna Covid-19 Vaccine Bivalent Booster 25yr & up 05/22/2021   PFIZER(Purple Top)SARS-COV-2 Vaccination 05/27/2020, 01/22/2021   Tdap 02/02/2019   Zoster Recombinat (Shingrix) 02/02/2019    TDAP status: Up to date  Flu 05/02/22    Covid-19 vaccine status: Completed vaccines  Qualifies for Shingles Vaccine? Yes   Zostavax completed Yes   Shingrix Completed?: No.    Education has been provided regarding the importance of this vaccine. Patient has been advised to call insurance company to determine out of pocket expense if they have not yet received this vaccine. Advised may also receive vaccine at local pharmacy or Health Dept. Verbalized acceptance and understanding.  Screening Tests Health Maintenance  Topic Date Due   Zoster Vaccines- Shingrix (2 of 2) 03/30/2019   COLONOSCOPY (Pts 45-471yrInsurance coverage will need to be confirmed)  03/09/2023   Medicare Annual Wellness (AWV)  10/15/2023   MAMMOGRAM  12/19/2023   DEXA SCAN  09/05/2025   DTaP/Tdap/Td (2 - Td or Tdap) 02/01/2029   INFLUENZA VACCINE  Completed   Hepatitis C Screening  Completed   HIV Screening  Completed   HPV VACCINES  Aged Out   COVID-19 Vaccine  Discontinued    Health Maintenance  Health Maintenance Due  Topic Date Due   Zoster Vaccines- Shingrix (2 of 2) 03/30/2019    Colorectal cancer screening: Type of screening: Colonoscopy. Completed 03/08/18. Repeat every 5 years  Mammogram status: Completed 12/18/21. Repeat every year  Bone Density status: Completed 09/05/22. Results reflect: Bone density results: OSTEOPENIA. Repeat every 3 years.   Additional Screening:  Hepatitis C Screening:  Completed 02/02/19  Vision Screening: Recommended annual ophthalmology exams  for early detection of glaucoma and other disorders of the eye. Is the patient up to date with their annual eye exam?  Yes  Who is the provider or what is the name of the office in which the patient attends annual eye exams? Unsure of providers name  If pt is not established with a provider, would they like to be referred to a provider to establish care? No .   Dental Screening: Recommended annual dental exams for proper oral hygiene  Community Resource Referral / Chronic Care Management: CRR required this visit?  No   CCM required this visit?  No      Plan:     I have personally reviewed and noted the following in the patient's chart:   Medical and social history Use of alcohol, tobacco or illicit drugs  Current medications and supplements including opioid prescriptions. Patient is not currently taking opioid prescriptions. Functional ability and status Nutritional status Physical activity Advanced directives List of other physicians Hospitalizations, surgeries, and ER visits in previous 12 months Vitals Screenings to include cognitive, depression, and falls Referrals and appointments  In addition, I have reviewed  and discussed with patient certain preventive protocols, quality metrics, and best practice recommendations. A written personalized care plan for preventive services as well as general preventive health recommendations were provided to patient.     Willette Brace, LPN   [id number]   Nurse Notes: none

## 2022-10-15 NOTE — Patient Instructions (Signed)
[name] , Thank you for taking time to come for your Medicare Wellness Visit. I appreciate your ongoing commitment to your health goals. Please review the following plan we discussed and let me know if I can assist you in the future.   These are the goals we discussed:  Goals      Patient Stated     Lose a few lbs         This is a list of the screening recommended for you and due dates:  Health Maintenance  Topic Date Due   Zoster (Shingles) Vaccine (2 of 2) 03/30/2019   Colon Cancer Screening  03/09/2023   Medicare Annual Wellness Visit  10/15/2023   Mammogram  12/19/2023   DEXA scan (bone density measurement)  09/05/2025   DTaP/Tdap/Td vaccine (2 - Td or Tdap) 02/01/2029   Flu Shot  Completed   Hepatitis C Screening: USPSTF Recommendation to screen - Ages 18-79 yo.  Completed   HIV Screening  Completed   HPV Vaccine  Aged Out   COVID-19 Vaccine  Discontinued    Advanced directives: Please bring a copy of your health care power of attorney and living will to the office at your convenience.  Conditions/risks identified: lose a few lbs  Next appointment: Follow up in one year for your annual wellness visit.   Preventive Care 40-64 Years, Female Preventive care refers to lifestyle choices and visits with your health care provider that can promote health and wellness. What does preventive care include? A yearly physical exam. This is also called an annual well check. Dental exams once or twice a year. Routine eye exams. Ask your health care provider how often you should have your eyes checked. Personal lifestyle choices, including: Daily care of your teeth and gums. Regular physical activity. Eating a healthy diet. Avoiding tobacco and drug use. Limiting alcohol use. Practicing safe sex. Taking low-dose aspirin daily starting at age 7. Taking vitamin and mineral supplements as recommended by your health care provider. What happens during an annual well check? The  services and screenings done by your health care provider during your annual well check will depend on your age, overall health, lifestyle risk factors, and family history of disease. Counseling  Your health care provider may ask you questions about your: Alcohol use. Tobacco use. Drug use. Emotional well-being. Home and relationship well-being. Sexual activity. Eating habits. Work and work Statistician. Method of birth control. Menstrual cycle. Pregnancy history. Screening  You may have the following tests or measurements: Height, weight, and BMI. Blood pressure. Lipid and cholesterol levels. These may be checked every 5 years, or more frequently if you are over 5 years old. Skin check. Lung cancer screening. You may have this screening every year starting at age 38 if you have a 30-pack-year history of smoking and currently smoke or have quit within the past 15 years. Fecal occult blood test (FOBT) of the stool. You may have this test every year starting at age 8. Flexible sigmoidoscopy or colonoscopy. You may have a sigmoidoscopy every 5 years or a colonoscopy every 10 years starting at age 64. Hepatitis C blood test. Hepatitis B blood test. Sexually transmitted disease (STD) testing. Diabetes screening. This is done by checking your blood sugar (glucose) after you have not eaten for a while (fasting). You may have this done every 1-3 years. Mammogram. This may be done every 1-2 years. Talk to your health care provider about when you should start having regular mammograms. This may  depend on whether you have a family history of breast cancer. BRCA-related cancer screening. This may be done if you have a family history of breast, ovarian, tubal, or peritoneal cancers. Pelvic exam and Pap test. This may be done every 3 years starting at age 20. Starting at age 2, this may be done every 5 years if you have a Pap test in combination with an HPV test. Bone density scan. This is done to  screen for osteoporosis. You may have this scan if you are at high risk for osteoporosis. Discuss your test results, treatment options, and if necessary, the need for more tests with your health care provider. Vaccines  Your health care provider may recommend certain vaccines, such as: Influenza vaccine. This is recommended every year. Tetanus, diphtheria, and acellular pertussis (Tdap, Td) vaccine. You may need a Td booster every 10 years. Zoster vaccine. You may need this after age 46. Pneumococcal 13-valent conjugate (PCV13) vaccine. You may need this if you have certain conditions and were not previously vaccinated. Pneumococcal polysaccharide (PPSV23) vaccine. You may need one or two doses if you smoke cigarettes or if you have certain conditions. Talk to your health care provider about which screenings and vaccines you need and how often you need them. This information is not intended to replace advice given to you by your health care provider. Make sure you discuss any questions you have with your health care provider. Document Released: 08/17/2015 Document Revised: 04/09/2016 Document Reviewed: 05/22/2015 Elsevier Interactive Patient Education  2017 [place] Prevention in the Home Falls can cause injuries. They can happen to people of all ages. There are many things you can do to make your home safe and to help prevent falls. What can I do on the outside of my home? Regularly fix the edges of walkways and driveways and fix any cracks. Remove anything that might make you trip as you walk through a door, such as a raised step or threshold. Trim any bushes or trees on the path to your home. Use bright outdoor lighting. Clear any walking paths of anything that might make someone trip, such as rocks or tools. Regularly check to see if handrails are loose or broken. Make sure that both sides of any steps have handrails. Any raised decks and porches should have guardrails on  the edges. Have any leaves, snow, or ice cleared regularly. Use sand or salt on walking paths during winter. Clean up any spills in your garage right away. This includes oil or grease spills. What can I do in the bathroom? Use night lights. Install grab bars by the toilet and in the tub and shower. Do not use towel bars as grab bars. Use non-skid mats or decals in the tub or shower. If you need to sit down in the shower, use a plastic, non-slip stool. Keep the floor dry. Clean up any water that spills on the floor as soon as it happens. Remove soap buildup in the tub or shower regularly. Attach bath mats securely with double-sided non-slip rug tape. Do not have throw rugs and other things on the floor that can make you trip. What can I do in the bedroom? Use night lights. Make sure that you have a light by your bed that is easy to reach. Do not use any sheets or blankets that are too big for your bed. They should not hang down onto the floor. Have a firm chair that has side arms. You can  use this for support while you get dressed. Do not have throw rugs and other things on the floor that can make you trip. What can I do in the kitchen? Clean up any spills right away. Avoid walking on wet floors. Keep items that you use a lot in easy-to-reach places. If you need to reach something above you, use a strong step stool that has a grab bar. Keep electrical cords out of the way. Do not use floor polish or wax that makes floors slippery. If you must use wax, use non-skid floor wax. Do not have throw rugs and other things on the floor that can make you trip. What can I do with my stairs? Do not leave any items on the stairs. Make sure that there are handrails on both sides of the stairs and use them. Fix handrails that are broken or loose. Make sure that handrails are as long as the stairways. Check any carpeting to make sure that it is firmly attached to the stairs. Fix any carpet that is loose  or worn. Avoid having throw rugs at the top or bottom of the stairs. If you do have throw rugs, attach them to the floor with carpet tape. Make sure that you have a light switch at the top of the stairs and the bottom of the stairs. If you do not have them, ask someone to add them for you. What else can I do to help prevent falls? Wear shoes that: Do not have high heels. Have rubber bottoms. Are comfortable and fit you well. Are closed at the toe. Do not wear sandals. If you use a stepladder: Make sure that it is fully opened. Do not climb a closed stepladder. Make sure that both sides of the stepladder are locked into place. Ask someone to hold it for you, if possible. Clearly mark and make sure that you can see: Any grab bars or handrails. First and last steps. Where the edge of each step is. Use tools that help you move around (mobility aids) if they are needed. These include: Canes. Walkers. Scooters. Crutches. Turn on the lights when you go into a dark area. Replace any light bulbs as soon as they burn out. Set up your furniture so you have a clear path. Avoid moving your furniture around. If any of your floors are uneven, fix them. If there are any pets around you, be aware of where they are. Review your medicines with your doctor. Some medicines can make you feel dizzy. This can increase your chance of falling. Ask your doctor what other things that you can do to help prevent falls. This information is not intended to replace advice given to you by your health care provider. Make sure you discuss any questions you have with your health care provider. Document Released: 05/17/2009 Document Revised: 12/27/2015 Document Reviewed: 08/25/2014 Elsevier Interactive Patient Education  2017 [employer].

## 2022-12-02 ENCOUNTER — Other Ambulatory Visit: Payer: Self-pay | Admitting: Family Medicine

## 2022-12-03 ENCOUNTER — Other Ambulatory Visit: Payer: Self-pay | Admitting: Family Medicine

## 2023-02-23 ENCOUNTER — Encounter: Payer: Medicare Other | Admitting: Family Medicine

## 2023-02-23 NOTE — Progress Notes (Signed)
Pt canceled

## 2023-02-23 NOTE — Patient Instructions (Signed)
No follow-ups on file.        Great to see you today.  I have refilled the medication(s) we provide.   If labs were collected, we will inform you of lab results once received either by echart message or telephone call.   - echart message- for normal results that have been seen by the patient already.   - telephone call: abnormal results or if patient has not viewed results in their echart.  

## 2023-02-27 ENCOUNTER — Other Ambulatory Visit: Payer: Self-pay | Admitting: Family Medicine

## 2023-06-29 ENCOUNTER — Other Ambulatory Visit: Payer: Self-pay | Admitting: Family Medicine

## 2023-08-20 ENCOUNTER — Other Ambulatory Visit: Payer: Self-pay | Admitting: Family Medicine

## 2023-10-21 ENCOUNTER — Ambulatory Visit (INDEPENDENT_AMBULATORY_CARE_PROVIDER_SITE_OTHER): Payer: Medicare Other | Admitting: *Deleted

## 2023-10-21 DIAGNOSIS — Z0000 Encounter for general adult medical examination without abnormal findings: Secondary | ICD-10-CM

## 2023-10-21 NOTE — Patient Instructions (Addendum)
 [name] , Thank you for taking time to come for your Medicare Wellness Visit. I appreciate your ongoing commitment to your health goals. Please review the following plan we discussed and let me know if I can assist you in the future.   Screening recommendations/referrals: Colonoscopy: Education provided Mammogram: up to date Bone Density: up to date Recommended yearly ophthalmology/optometry visit for glaucoma screening and checkup Recommended yearly dental visit for hygiene and checkup  Vaccinations: Influenza vaccine:  Pneumococcal vaccine:  Tdap vaccine: up to date Shingles vaccine: up to date      Preventive Care 40-64 Years and Older, Female Preventive care refers to lifestyle choices and visits with your health care provider that can promote health and wellness. What does preventive care include? A yearly physical exam. This is also called an annual well check. Dental exams once or twice a year. Routine eye exams. Ask your health care provider how often you should have your eyes checked. Personal lifestyle choices, including: Daily care of your teeth and gums. Regular physical activity. Eating a healthy diet. Avoiding tobacco and drug use. Limiting alcohol use. Practicing safe sex. Taking low-dose aspirin every day. Taking vitamin and mineral supplements as recommended by your health care provider. What happens during an annual well check? The services and screenings done by your health care provider during your annual well check will depend on your age, overall health, lifestyle risk factors, and family history of disease. Counseling  Your health care provider may ask you questions about your: Alcohol use. Tobacco use. Drug use. Emotional well-being. Home and relationship well-being. Sexual activity. Eating habits. History of falls. Memory and ability to understand (cognition). Work and work Astronomer. Reproductive health. Screening  You may have the  following tests or measurements: Height, weight, and BMI. Blood pressure. Lipid and cholesterol levels. These may be checked every 5 years, or more frequently if you are over 84 years old. Skin check. Lung cancer screening. You may have this screening every year starting at age 74 if you have a 30-pack-year history of smoking and currently smoke or have quit within the past 15 years. Fecal occult blood test (FOBT) of the stool. You may have this test every year starting at age 75. Flexible sigmoidoscopy or colonoscopy. You may have a sigmoidoscopy every 5 years or a colonoscopy every 10 years starting at age 49. Hepatitis C blood test. Hepatitis B blood test. Sexually transmitted disease (STD) testing. Diabetes screening. This is done by checking your blood sugar (glucose) after you have not eaten for a while (fasting). You may have this done every 1-3 years. Bone density scan. This is done to screen for osteoporosis. You may have this done starting at age 65. Mammogram. This may be done every 1-2 years. Talk to your health care provider about how often you should have regular mammograms. Talk with your health care provider about your test results, treatment options, and if necessary, the need for more tests. Vaccines  Your health care provider may recommend certain vaccines, such as: Influenza vaccine. This is recommended every year. Tetanus, diphtheria, and acellular pertussis (Tdap, Td) vaccine. You may need a Td booster every 10 years. Zoster vaccine. You may need this after age 65. Pneumococcal 13-valent conjugate (PCV13) vaccine. One dose is recommended after age 32. Pneumococcal polysaccharide (PPSV23) vaccine. One dose is recommended after age 40. Talk to your health care provider about which screenings and vaccines you need and how often you need them. This information is not intended to  replace advice given to you by your health care provider. Make sure you discuss any questions you  have with your health care provider. Document Released: 08/17/2015 Document Revised: 04/09/2016 Document Reviewed: 05/22/2015 Elsevier Interactive Patient Education  2017 [employer].  Fall Prevention in the Home Falls can cause injuries. They can happen to people of all ages. There are many things you can do to make your home safe and to help prevent falls. What can I do on the outside of my home? Regularly fix the edges of walkways and driveways and fix any cracks. Remove anything that might make you trip as you walk through a door, such as a raised step or threshold. Trim any bushes or trees on the path to your home. Use bright outdoor lighting. Clear any walking paths of anything that might make someone trip, such as rocks or tools. Regularly check to see if handrails are loose or broken. Make sure that both sides of any steps have handrails. Any raised decks and porches should have guardrails on the edges. Have any leaves, snow, or ice cleared regularly. Use sand or salt on walking paths during winter. Clean up any spills in your garage right away. This includes oil or grease spills. What can I do in the bathroom? Use night lights. Install grab bars by the toilet and in the tub and shower. Do not use towel bars as grab bars. Use non-skid mats or decals in the tub or shower. If you need to sit down in the shower, use a plastic, non-slip stool. Keep the floor dry. Clean up any water that spills on the floor as soon as it happens. Remove soap buildup in the tub or shower regularly. Attach bath mats securely with double-sided non-slip rug tape. Do not have throw rugs and other things on the floor that can make you trip. What can I do in the bedroom? Use night lights. Make sure that you have a light by your bed that is easy to reach. Do not use any sheets or blankets that are too big for your bed. They should not hang down onto the floor. Have a firm chair that has side arms. You can  use this for support while you get dressed. Do not have throw rugs and other things on the floor that can make you trip. What can I do in the kitchen? Clean up any spills right away. Avoid walking on wet floors. Keep items that you use a lot in easy-to-reach places. If you need to reach something above you, use a strong step stool that has a grab bar. Keep electrical cords out of the way. Do not use floor polish or wax that makes floors slippery. If you must use wax, use non-skid floor wax. Do not have throw rugs and other things on the floor that can make you trip. What can I do with my stairs? Do not leave any items on the stairs. Make sure that there are handrails on both sides of the stairs and use them. Fix handrails that are broken or loose. Make sure that handrails are as long as the stairways. Check any carpeting to make sure that it is firmly attached to the stairs. Fix any carpet that is loose or worn. Avoid having throw rugs at the top or bottom of the stairs. If you do have throw rugs, attach them to the floor with carpet tape. Make sure that you have a light switch at the top of the stairs and the  bottom of the stairs. If you do not have them, ask someone to add them for you. What else can I do to help prevent falls? Wear shoes that: Do not have high heels. Have rubber bottoms. Are comfortable and fit you well. Are closed at the toe. Do not wear sandals. If you use a stepladder: Make sure that it is fully opened. Do not climb a closed stepladder. Make sure that both sides of the stepladder are locked into place. Ask someone to hold it for you, if possible. Clearly mark and make sure that you can see: Any grab bars or handrails. First and last steps. Where the edge of each step is. Use tools that help you move around (mobility aids) if they are needed. These include: Canes. Walkers. Scooters. Crutches. Turn on the lights when you go into a dark area. Replace any light  bulbs as soon as they burn out. Set up your furniture so you have a clear path. Avoid moving your furniture around. If any of your floors are uneven, fix them. If there are any pets around you, be aware of where they are. Review your medicines with your doctor. Some medicines can make you feel dizzy. This can increase your chance of falling. Ask your doctor what other things that you can do to help prevent falls. This information is not intended to replace advice given to you by your health care provider. Make sure you discuss any questions you have with your health care provider. Document Released: 05/17/2009 Document Revised: 12/27/2015 Document Reviewed: 08/25/2014 Elsevier Interactive Patient Education  2017 [employer].

## 2023-10-21 NOTE — Progress Notes (Signed)
 Subjective:   Katie Rojas is a 63 y.o. female who presents for Medicare Annual (Subsequent) preventive examination.  Visit Complete: Virtual I connected with  Samhita Kretsch on 10/21/23 by a audio enabled telemedicine application and verified that I am speaking with the correct person using two identifiers.  Patient Location: Home  Provider Location: Home Office  I discussed the limitations of evaluation and management by telemedicine. The patient expressed understanding and agreed to proceed.  Vital Signs: Because this visit was a virtual/telehealth visit, some criteria may be missing or patient reported. Any vitals not documented were not able to be obtained and vitals that have been documented are patient reported.   Cardiac Risk Factors include: advanced age (>36men, >19 women)     Objective:    There were no vitals filed for this visit. There is no height or weight on file to calculate BMI.     10/21/2023    1:43 PM 10/15/2022   12:01 PM 02/03/2020    9:31 AM  Advanced Directives  Does Patient Have a Medical Advance Directive? Yes No No  Type of Advance Directive Living will    Would patient like information on creating a medical advance directive?  No - Patient declined     Current Medications (verified) Outpatient Encounter Medications as of 10/21/2023  Medication Sig   alendronate (FOSAMAX) 70 MG tablet Take 1 tablet (70 mg total) by mouth every 7 (seven) days. Take with a full glass of water on an empty stomach.   atorvastatin (LIPITOR) 20 MG tablet Take 1 tablet (20 mg total) by mouth daily.   Cholecalciferol (VITAMIN D3) 125 MCG (5000 UT) TABS Take 1 tablet (5,000 Units total) by mouth daily.   clonazePAM (KLONOPIN) 1 MG tablet Take 1 tablet (1 mg total) by mouth 3 (three) times daily as needed for anxiety. (Patient taking differently: Take 1 mg by mouth 2 (two) times daily.)   levothyroxine (SYNTHROID) 112 MCG tablet 1 tab po 6d a week and 1/2 tab on  Sunday   primidone (MYSOLINE) 50 MG tablet Take 1 tablet (50 mg total) by mouth at bedtime.   venlafaxine XR (EFFEXOR-XR) 150 MG 24 hr capsule Take 150 mg by mouth daily.   VRAYLAR 1.5 MG capsule Take 1.5 mg by mouth daily. (Patient not taking: Reported on 10/15/2022)   No facility-administered encounter medications on file as of 10/21/2023.    Allergies (verified) Relafen [nabumetone]   History: Past Medical History:  Diagnosis Date   Chicken pox    Depression with anxiety    Fracture of 5th metatarsal 2017   rigth ankle sprain and 5th meatarsal fx- managed with boot.    Gait instability 08/12/2019   Hepatitis B 1979   History of colon polyps    Hyperthyroidism 2005   Nuclear rad tx (I131)-- on synthoid   Obsessive-compulsive disorder    Ophthalmic Graves disease 2005   Right shoulder pain 11/12/2017   Normal x-ray.  Mild spurring of AC joint of shoulder.   Tibial plateau fracture, left 08/31/2019    MRI left knee - 08/19/2019- acute nondisplaced fracture of the posterolateral tibial plateau, acute nondisplaced fracture of the fibular head, complex flap tear of the medial meniscus, tear of the lateral meniscus, grade 1 sprain of the medial collateral ligament, tricompartmental chondrosis most notable in the patellar cartilage, moderate large knee joint effusion with synovitis, moderate compl   Past Surgical History:  Procedure Laterality Date   ABDOMINAL HYSTERECTOMY  BLEPHAROPLASTY Bilateral 2005   Secondary from proptosis from ocular Graves' disease.   ORBITAL OSTEOTOMY Bilateral    Ocular Graves' disease   WISDOM TOOTH EXTRACTION     Family History  Problem Relation Age of Onset   Schizophrenia Paternal Grandmother    Arthritis Paternal Grandmother    Drug abuse Brother    Alcohol abuse Brother    Learning disabilities Sister    COPD Mother    Hypertension Mother    Miscarriages / Stillbirths Mother    Lung cancer Mother    Tremor Mother        bilateral hands    Early death Father    Hypertension Father    Pancreatitis Father    Depression Daughter    Alcohol abuse Maternal Grandmother    Hearing loss Maternal Grandmother    Alcoholism Maternal Grandfather    Hearing loss Paternal Grandfather    Stroke Paternal Grandfather    Depression Sister    Tremor Sister        left hand- oldest of her sisters   Alcohol abuse Sister    Tremor Nephew        hand; youngest sisters son   Social History   Socioeconomic History   Marital status: Widowed    Spouse name: Not on file   Number of children: 2   Years of education: 2 years of college   Highest education level: Associate degree: occupational, Scientist, product/process development, or vocational program  Occupational History   Occupation: accountant    Comment: 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  Tobacco Use   Smoking status: Former    Current packs/day: 0.00    Types: Cigarettes    Quit date: 09/22/2003    Years since quitting: 20.0   Smokeless tobacco: Never  Vaping Use   Vaping status: Never Used  Substance and Sexual Activity   Alcohol use: Yes    Comment: 2-3 glasses wine/week   Drug use: Not Currently   Sexual activity: Not Currently  Other Topics Concern   Not on file  Social History Narrative   Marital status/children/pets: Married   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, retired   Field seismologist:      -smoke alarm in the home:Yes     - wears seatbelt: Yes     - Feels safe in their relationships: Yes   Social Drivers of Corporate investment banker Strain: Low Risk  (10/21/2023)   Overall Financial Resource Strain (CARDIA)    Difficulty of Paying Living Expenses: Not hard at all  Food Insecurity: No Food Insecurity (10/21/2023)   Hunger Vital Sign    Worried About Running Out of Food in the Last Year: Never true    Ran Out of Food in the Last Year: Never true  Transportation Needs: No Transportation Needs (10/21/2023)   PRAPARE - Administrator, Civil Service (Medical): No    Lack of  Transportation (Non-Medical): No  Physical Activity: Inactive (10/21/2023)   Exercise Vital Sign    Days of Exercise per Week: 0 days    Minutes of Exercise per Session: 0 min  Stress: Stress Concern Present (10/21/2023)   [employer] of Occupational Health - Occupational Stress Questionnaire    Feeling of Stress : To some extent  Social Connections: Moderately Isolated (10/21/2023)   Social Connection and Isolation Panel [NHANES]    Frequency of Communication with Friends and Family: More than three times a week    Frequency of Social Gatherings with Friends  and Family: Once a week    Attends Religious Services: More than 4 times per year    Active Member of Clubs or Organizations: No    Attends Banker Meetings: Never    Marital Status: Widowed    Tobacco Counseling Counseling given: Not Answered   Clinical Intake:  Pre-visit preparation completed: Yes  Pain : No/denies pain     Diabetes: No  How often do you need to have someone help you when you read instructions, pamphlets, or other written materials from your doctor or pharmacy?: 1 - Never  Interpreter Needed?: No  Information entered by :: Remi Haggard LPN   Activities of Daily Living    10/21/2023    1:48 PM  In your present state of health, do you have any difficulty performing the following activities:  Hearing? 0  Vision? 0  Difficulty concentrating or making decisions? 0  Walking or climbing stairs? 0  Dressing or bathing? 0  Doing errands, shopping? 0  Preparing Food and eating ? N  Using the Toilet? N  In the past six months, have you accidently leaked urine? Y  Do you have problems with loss of bowel control? N  Managing your Medications? N  Managing your Finances? N  Housekeeping or managing your Housekeeping? N    Patient Care Team: Natalia Leatherwood, DO as PCP - General (Family Medicine) Pucilowski, Roosvelt Maser, MD (Inactive) as Consulting Physician (Psychiatry) Tat, Octaviano Batty,  DO as Consulting Physician (Neurology) Kerin Salen, MD as Consulting Physician (Gastroenterology)  Indicate any recent Medical Services you may have received from other than [facility] providers in the past year (date may be approximate).     Assessment:   This is a routine wellness examination for Katie Rojas.  Hearing/Vision screen Hearing Screening - Comments:: No trouble hearing Vision Screening - Comments:: Not up to date Unsure of name [facility]   Goals Addressed             This Visit's Progress    Increase physical activity       Loose some weight     Patient Stated   Not on track    Lose a few lbs        Depression Screen    10/21/2023    1:46 PM 10/15/2022   11:59 AM 10/10/2021    2:01 PM 04/16/2021    1:17 PM 02/05/2021   11:15 AM 02/02/2019    9:11 AM 10/21/2018    9:14 AM  PHQ 2/9 Scores  PHQ - 2 Score 3 1 0 4 6 5 4   PHQ- 9 Score 7   13 20 16 13     Fall Risk    10/21/2023    1:42 PM 10/15/2022   11:31 AM 10/10/2021    2:09 PM 02/03/2020    9:31 AM 09/06/2019    9:37 AM  Fall Risk   Falls in the past year? 1 1 0 1 1  Number falls in past yr: 0 0 0 1 1  Injury with Fall? 0 0 0 1 1  Risk for fall due to :  Impaired vision History of fall(s)    Follow up Falls evaluation completed;Education provided;Falls prevention discussed Falls prevention discussed Falls evaluation completed      MEDICARE RISK AT HOME: Medicare Risk at Home Any stairs in or around the home?: Yes If so, are there any without handrails?: No Home free of loose throw rugs in walkways, pet beds, electrical cords, etc?:  Yes Adequate lighting in your home to reduce risk of falls?: Yes Life alert?: No Use of a cane, walker or w/c?: No Grab bars in the bathroom?: Yes Shower chair or bench in shower?: No Elevated toilet seat or a handicapped toilet?: Yes  TIMED UP AND GO:  Was the test performed?  No    Cognitive Function:        10/21/2023    1:44 PM 10/15/2022   12:03 PM 10/10/2021    2:04  PM  6CIT Screen  What Year? 0 points 0 points 0 points  What month? 0 points 0 points 0 points  What time? 0 points 0 points 0 points  Count back from 20 0 points 0 points 0 points  Months in reverse 0 points 0 points 0 points  Repeat phrase 0 points 0 points 0 points  Total Score 0 points 0 points 0 points    Immunizations Immunization History  Administered Date(s) Administered   Influenza,inj,Quad PF,6+ Mos 05/18/2018, 05/21/2019, 04/16/2021, 05/02/2022   Influenza-Unspecified 05/02/2022   Janssen (J&J) SARS-COV-2 Vaccination 10/15/2019   Moderna Covid-19 Fall Seasonal Vaccine 83yrs & older 05/02/2022   Moderna Covid-19 Vaccine Bivalent Booster 63yrs & up 05/22/2021   PFIZER(Purple Top)SARS-COV-2 Vaccination 05/27/2020, 01/22/2021   Tdap 02/02/2019   Zoster Recombinant(Shingrix) 12/11/2016, 02/02/2019    TDAP status: Up to date  Flu Vaccine status: Declined, Education has been provided regarding the importance of this vaccine but patient still declined. Advised may receive this vaccine at local pharmacy or Health Dept. Aware to provide a copy of the vaccination record if obtained from local pharmacy or Health Dept. Verbalized acceptance and understanding.  Pneumococcal vaccine status: Due, Education has been provided regarding the importance of this vaccine. Advised may receive this vaccine at local pharmacy or Health Dept. Aware to provide a copy of the vaccination record if obtained from local pharmacy or Health Dept. Verbalized acceptance and understanding.  Covid-19 vaccine status: Information provided on how to obtain vaccines.   Qualifies for Shingles Vaccine? No   Zostavax completed Yes   Shingrix Completed?: Yes  Screening Tests Health Maintenance  Topic Date Due   Colonoscopy  03/09/2023   INFLUENZA VACCINE  11/02/2023 (Originally 03/05/2023)   MAMMOGRAM  12/19/2023   Medicare Annual Wellness (AWV)  10/20/2024   DEXA SCAN  09/05/2025   DTaP/Tdap/Td (2 - Td or  Tdap) 02/01/2029   Hepatitis C Screening  Completed   HIV Screening  Completed   Zoster Vaccines- Shingrix  Completed   HPV VACCINES  Aged Out   Pneumococcal Vaccine 74-21 Years old  Discontinued   COVID-19 Vaccine  Discontinued    Health Maintenance  Health Maintenance Due  Topic Date Due   Colonoscopy  03/09/2023    Colonoscopy  patient is moving and will schedule when she gets established with a new provider  Mammogram status: Completed  . Repeat every year  Bone Density status: Completed 2024. Results reflect: Bone density results: OSTEOPENIA. Repeat every 3 years.  Lung Cancer Screening: (Low Dose CT Chest recommended if Age 47-80 years, 20 pack-year currently smoking OR have quit w/in 15years.) does not qualify.   Lung Cancer Screening Referral:   Additional Screening:  Hepatitis C Screening: does not qualify; Completed 2020  Vision Screening: Recommended annual ophthalmology exams for early detection of glaucoma and other disorders of the eye. Is the patient up to date with their annual eye exam?  Yes  Who is the provider or what is the name of the  office in which the patient attends annual eye exams? Unsure of name  If pt is not established with a provider, would they like to be referred to a provider to establish care? No .   Dental Screening: Recommended annual dental exams for proper oral hygiene    Community Resource Referral / Chronic Care Management: CRR required this visit?  No   CCM required this visit?  No     Plan:     I have personally reviewed and noted the following in the patient's chart:   Medical and social history Use of alcohol, tobacco or illicit drugs  Current medications and supplements including opioid prescriptions. Patient is not currently taking opioid prescriptions. Functional ability and status Nutritional status Physical activity Advanced directives List of other physicians Hospitalizations, surgeries, and ER visits in  previous 12 months Vitals Screenings to include cognitive, depression, and falls Referrals and appointments  In addition, I have reviewed and discussed with patient certain preventive protocols, quality metrics, and best practice recommendations. A written personalized care plan for preventive services as well as general preventive health recommendations were provided to patient.     Remi Haggard, LPN   [id number]/[id number]   After Visit Summary: (MyChart) Due to this being a telephonic visit, the after visit summary with patients personalized plan was offered to patient via MyChart   Nurse Notes:
